# Patient Record
Sex: Female | Born: 1980 | Race: White | Hispanic: No | Marital: Married | Smoking: Current every day smoker
Health system: Southern US, Community
[De-identification: ages and names within clinical notes are randomized; demographics above are authoritative.]

## PROBLEM LIST (undated history)

## (undated) DIAGNOSIS — K589 Irritable bowel syndrome without diarrhea: Secondary | ICD-10-CM

## (undated) DIAGNOSIS — F1021 Alcohol dependence, in remission: Secondary | ICD-10-CM

## (undated) DIAGNOSIS — F509 Eating disorder, unspecified: Secondary | ICD-10-CM

## (undated) DIAGNOSIS — R1013 Epigastric pain: Secondary | ICD-10-CM

## (undated) DIAGNOSIS — F419 Anxiety disorder, unspecified: Secondary | ICD-10-CM

## (undated) DIAGNOSIS — F329 Major depressive disorder, single episode, unspecified: Secondary | ICD-10-CM

## (undated) DIAGNOSIS — K219 Gastro-esophageal reflux disease without esophagitis: Secondary | ICD-10-CM

## (undated) DIAGNOSIS — F32A Depression, unspecified: Secondary | ICD-10-CM

## (undated) HISTORY — DX: Eating disorder, unspecified: F50.9

## (undated) HISTORY — DX: Anxiety disorder, unspecified: F41.9

## (undated) HISTORY — DX: Irritable bowel syndrome, unspecified: K58.9

## (undated) HISTORY — DX: Alcohol dependence, in remission: F10.21

## (undated) HISTORY — DX: Depression, unspecified: F32.A

## (undated) HISTORY — DX: Gastro-esophageal reflux disease without esophagitis: K21.9

## (undated) HISTORY — DX: Major depressive disorder, single episode, unspecified: F32.9

---

## 2008-08-31 HISTORY — PX: UPPER GASTROINTESTINAL ENDOSCOPY: SHX188

## 2018-07-18 ENCOUNTER — Encounter: Payer: Self-pay | Admitting: Gastroenterology

## 2018-08-12 ENCOUNTER — Ambulatory Visit: Payer: 59 | Admitting: Gastroenterology

## 2018-08-12 ENCOUNTER — Other Ambulatory Visit (INDEPENDENT_AMBULATORY_CARE_PROVIDER_SITE_OTHER): Payer: 59

## 2018-08-12 ENCOUNTER — Encounter: Payer: Self-pay | Admitting: Gastroenterology

## 2018-08-12 VITALS — BP 127/77 | HR 77 | Ht 63.0 in | Wt 125.0 lb

## 2018-08-12 DIAGNOSIS — R109 Unspecified abdominal pain: Secondary | ICD-10-CM

## 2018-08-12 DIAGNOSIS — K5900 Constipation, unspecified: Secondary | ICD-10-CM | POA: Diagnosis not present

## 2018-08-12 DIAGNOSIS — K625 Hemorrhage of anus and rectum: Secondary | ICD-10-CM

## 2018-08-12 DIAGNOSIS — R6881 Early satiety: Secondary | ICD-10-CM

## 2018-08-12 DIAGNOSIS — R140 Abdominal distension (gaseous): Secondary | ICD-10-CM

## 2018-08-12 DIAGNOSIS — R1013 Epigastric pain: Secondary | ICD-10-CM

## 2018-08-12 DIAGNOSIS — F419 Anxiety disorder, unspecified: Secondary | ICD-10-CM

## 2018-08-12 LAB — CBC WITH DIFFERENTIAL/PLATELET
Basophils Absolute: 0 10*3/uL (ref 0.0–0.1)
Basophils Relative: 0.8 % (ref 0.0–3.0)
Eosinophils Absolute: 0.1 10*3/uL (ref 0.0–0.7)
Eosinophils Relative: 1.7 % (ref 0.0–5.0)
HCT: 44.6 % (ref 36.0–46.0)
Hemoglobin: 15.2 g/dL — ABNORMAL HIGH (ref 12.0–15.0)
Lymphocytes Relative: 26.2 % (ref 12.0–46.0)
Lymphs Abs: 1.6 10*3/uL (ref 0.7–4.0)
MCHC: 34.2 g/dL (ref 30.0–36.0)
MCV: 88.8 fl (ref 78.0–100.0)
Monocytes Absolute: 0.6 10*3/uL (ref 0.1–1.0)
Monocytes Relative: 9.4 % (ref 3.0–12.0)
Neutro Abs: 3.8 10*3/uL (ref 1.4–7.7)
Neutrophils Relative %: 61.9 % (ref 43.0–77.0)
Platelets: 197 10*3/uL (ref 150.0–400.0)
RBC: 5.02 Mil/uL (ref 3.87–5.11)
RDW: 13.5 % (ref 11.5–15.5)
WBC: 6.1 10*3/uL (ref 4.0–10.5)

## 2018-08-12 LAB — IGA: IgA: 152 mg/dL (ref 68–378)

## 2018-08-12 LAB — TSH: TSH: 1.52 u[IU]/mL (ref 0.35–4.50)

## 2018-08-12 LAB — COMPREHENSIVE METABOLIC PANEL
ALT: 7 U/L (ref 0–35)
AST: 15 U/L (ref 0–37)
Albumin: 4.6 g/dL (ref 3.5–5.2)
Alkaline Phosphatase: 41 U/L (ref 39–117)
BUN: 5 mg/dL — ABNORMAL LOW (ref 6–23)
CALCIUM: 9.5 mg/dL (ref 8.4–10.5)
CO2: 25 mEq/L (ref 19–32)
Chloride: 102 mEq/L (ref 96–112)
Creatinine, Ser: 0.69 mg/dL (ref 0.40–1.20)
GFR: 101.08 mL/min (ref >60.00)
Glucose, Bld: 84 mg/dL (ref 70–99)
Potassium: 4.2 mEq/L (ref 3.5–5.1)
Sodium: 137 mEq/L (ref 135–145)
Total Bilirubin: 0.5 mg/dL (ref 0.2–1.2)
Total Protein: 7 g/dL (ref 6.0–8.3)

## 2018-08-12 MED ORDER — OMEPRAZOLE 40 MG PO CPDR: 40.0000 mg | DELAYED_RELEASE_CAPSULE | Freq: Every day | ORAL | 3 refills | Status: DC

## 2018-08-12 MED ORDER — ONDANSETRON 4 MG PO TBDP: 4.0000 mg | ORAL_TABLET | Freq: Four times a day (QID) | ORAL | 3 refills | Status: DC | PRN

## 2018-08-12 MED ORDER — POLYETHYLENE GLYCOL 3350 17 G PO PACK: PACK | ORAL | 0 refills | Status: DC

## 2018-08-12 MED ORDER — DICYCLOMINE HCL 10 MG PO CAPS: 10.0000 mg | ORAL_CAPSULE | Freq: Three times a day (TID) | ORAL | 3 refills | Status: DC | PRN

## 2018-08-12 MED ORDER — SUPREP BOWEL PREP KIT 17.5-3.13-1.6 GM/177ML PO SOLN: ORAL | 0 refills | Status: DC

## 2018-08-12 NOTE — Progress Notes (Signed)
HPI :  37 year old female with a history of anxiety, reported irritable bowel syndrome, anorexia/bulimia, alcohol use, self-referred for new patient visit regarding multiple bowel symptoms.  She states she's had problems with her stomach in bowels for "years". Earlier in her life, several years ago, she had problems with anorexia and bulimia related to her anxiety. She reports that has been much better controlled recently and is not an active issue. She also reports a history of alcoholism with significant alcohol use in the past, she's been sober for the past 6 or 7 years now. No known history of liver disease.  She has multiple symptoms that bother her. She has discomfort in her upper abdomen after she eats, epigastric area. She eats about one meal a day due to the symptoms, often afraid to eat due to concerning symptoms of discomfort. She has early satiety that bothers her. She often belches her food back up after she eats. She feels like it "takes a long time" for her food to digest. She denies much reflux or regurgitation. She has rare dysphagia. She has frequent nausea, but not much vomiting. Main upper tract symptoms appear to be early satiety and upper abdominal discomfort after meals, and nausea. She also endorses a lot of bloating and abdominal distention that can occur. She usually has a bowel movement every morning, however doesn't often feel like she is evacuated enough. She denies any diarrhea. Has some periodic rectal bleeding, ccasionally has blood on the toilet paper and has some rectal pain. She is a squatting potty to help her bowel movements. She reports her stools are thin in nature. She has a lot of abdominal cramping.   She has not taked any PPIs in the past for any of the symptoms. She is currently taking digestive enzymes supplementation as well as a probiotic and fiber supplementation. She has significant symptoms of a daily basis despite taking these, unclear how much these help  her.  Her brother has Crohn's disease reportedly. She denies any family history of colon cancer. She smokes 1 pack per day of cigarettes. She denies any alcohol use currently. She denies any marijuana use. She had an EGD reportedly in 2003, she does not think that showed anything concerning however no report available. She's never had a prior colonoscopy. It does not appear she is ever been tested for celiac disease.  She reports she is extremely anxious about these symptoms and chronically does not feel well. She is taking Lexapro for anxiety and states it may take the [place] off her symptoms but doesn't provide any significant benefit. She is frustrated by the lack of improvement with symptoms over time.  We have no prior records of prior labs or workup for this.     Past Medical History:  Diagnosis Date  . Alcoholism in remission ([place])   . Anxiety   . Depression   . Eating disorder    history of anorexia and bulemia  . GERD (gastroesophageal reflux disease)   . IBS (irritable bowel syndrome)      Past Surgical History:  Procedure Laterality Date  . UPPER GASTROINTESTINAL ENDOSCOPY  2010   Family History  Problem Relation Age of Onset  . Colon cancer Paternal Grandmother    Social History   Tobacco Use  . Smoking status: Current Every Day Smoker    Packs/day: 1.00  . Smokeless tobacco: Never Used  Substance Use Topics  . Alcohol use: Not Currently  . Drug use: Never   Current  Outpatient Medications  Medication Sig Dispense Refill  . escitalopram (LEXAPRO) 20 MG tablet Take 20 mg by mouth daily.    Marland Kitchen dicyclomine (BENTYL) 10 MG capsule Take 1 capsule (10 mg total) by mouth every 8 (eight) hours as needed for spasms. 30 capsule 3  . omeprazole (PRILOSEC) 40 MG capsule Take 1 capsule (40 mg total) by mouth daily. 30 capsule 3  . ondansetron (ZOFRAN ODT) 4 MG disintegrating tablet Take 1 tablet (4 mg total) by mouth every 6 (six) hours as needed for nausea or vomiting. 30  tablet 3  . polyethylene glycol (MIRALAX) packet Take a double dose, as directed, daily 14 each 0  . SUPREP BOWEL PREP KIT 17.5-3.13-1.6 GM/177ML SOLN Suprep-Use as directed 354 mL 0   No current facility-administered medications for this visit.    No Known Allergies   Review of Systems: All systems reviewed and negative except where noted in HPI.     Physical Exam: BP 127/77   Pulse 77   Ht _0  (1.6 m)   Wt 125 lb (56.7 kg)   SpO2 98%   BMI 22.14 kg/m  Constitutional: Pleasant, female in no acute distress. HEENT: Normocephalic and atraumatic. Conjunctivae are normal. No scleral icterus. Neck supple.  Cardiovascular: Normal rate, regular rhythm.  Pulmonary/chest: Effort normal and breath sounds normal. No wheezing, rales or rhonchi. Abdominal: Soft, nondistended, nontender. There are no masses palpable. No hepatomegaly. Extremities: no edema Lymphadenopathy: No cervical adenopathy noted. Neurological: Alert and oriented to person place and time. Skin: Skin is warm and dry. No rashes noted. Psychiatric: Normal mood and affect. Behavior is normal.   ASSESSMENT AND PLAN: 37 year old female with history as outlined above, here for new patient assessment as outlined below:  Dyspepsia / early satiety / abdominal pain / rectal bleeding / constipation / anxiety - she's had longstanding symptoms in the setting of severe anxiety. I discussed ddx with her. 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 However, she does have a family history of Crohn's disease, and I think this needs to be ruled out as well as celiac disease. We have no prior workup on file for her, will send basic labs today - CBC, CMET, TSH, and labs for celiac disease. Also recommend EGD and colonoscopy - will rule out PUD, H pylori, celiac, IBD, etc. I have discussed the risks / benefits  of endoscopy and anesthesia with her and she wished to proceed. In the interim discussed some options to treat some of her symptoms. Will give her some Zofran to take for nausea, and give a trial of omeprazole 74m once daily for the next few weeks to treat dyspepsia and see if that helps. For her constipation recommend she take Miralax once daily and titrate up as needed. Will also provide bentyl to use PRN for cramps. If her endoscopies show pathology to cause her symptoms, we will address it. If they are normal without any pathology noted, will treat for dyspepsia and IBS. She would be a good candidate for buspirone given her anxiety. She agreed with the plan, further recommendations pending the results.   Total time 60 minutes spent with patient and coordinating care  SCarolina Cellar MD [facility]

## 2018-08-12 NOTE — Patient Instructions (Addendum)
If you are age 38 or older, your body mass index should be between 23-30. Your Body mass index is 22.14 kg/m. If this is out of the aforementioned range listed, please consider follow up with your Primary Care Provider.  If you are age 26 or younger, your body mass index should be between 19-25. Your Body mass index is 22.14 kg/m. If this is out of the aformentioned range listed, please consider follow up with your Primary Care Provider.   Your provider has requested that you go to the basement level for lab work before leaving today. Press "B" on the elevator. The lab is located at the first door on the left as you exit the elevator.  You have been scheduled for an endoscopy and colonoscopy. Please follow the written instructions given to you at your visit today. Please pick up your prep supplies at the pharmacy within the next 1-3 days. If you use inhalers (even only as needed), please bring them with you on the day of your procedure. Your physician has requested that you go to www.startemmi.com and enter the access code given to you at your visit today. This web site gives a general overview about your procedure. However, you should still follow specific instructions given to you by our office regarding your preparation for the procedure.  We have sent the following medications to your pharmacy for you to pick up at your convenience: Omeprazole 40mg : Take once daily zofran 4 mg ODT: Take every 6 hours as needed Bentyl 10mg : Take every 8 hours as needed  Please purchase the following medications over the counter and take as directed: Miralax: Take a daily dose daily   Please discontinue your Probiotic.  Thank you for entrusting me with your care and for choosing [facility], Dr. Leonardo Cellar

## 2018-08-15 LAB — TISSUE TRANSGLUTAMINASE, IGA: (tTG) Ab, IgA: 1 U/mL

## 2018-08-23 ENCOUNTER — Encounter: Payer: Self-pay | Admitting: Gastroenterology

## 2018-09-02 ENCOUNTER — Encounter: Payer: Self-pay | Admitting: Gastroenterology

## 2018-09-02 ENCOUNTER — Ambulatory Visit (AMBULATORY_SURGERY_CENTER): Payer: 59 | Admitting: Gastroenterology

## 2018-09-02 VITALS — BP 104/68 | HR 69 | Temp 97.5°F | Resp 14 | Ht 63.0 in | Wt 125.0 lb

## 2018-09-02 DIAGNOSIS — K5900 Constipation, unspecified: Secondary | ICD-10-CM

## 2018-09-02 DIAGNOSIS — K2970 Gastritis, unspecified, without bleeding: Secondary | ICD-10-CM | POA: Diagnosis not present

## 2018-09-02 DIAGNOSIS — K449 Diaphragmatic hernia without obstruction or gangrene: Secondary | ICD-10-CM | POA: Diagnosis not present

## 2018-09-02 DIAGNOSIS — R1013 Epigastric pain: Secondary | ICD-10-CM

## 2018-09-02 DIAGNOSIS — K2950 Unspecified chronic gastritis without bleeding: Secondary | ICD-10-CM

## 2018-09-02 DIAGNOSIS — K621 Rectal polyp: Secondary | ICD-10-CM

## 2018-09-02 DIAGNOSIS — D128 Benign neoplasm of rectum: Secondary | ICD-10-CM

## 2018-09-02 DIAGNOSIS — K625 Hemorrhage of anus and rectum: Secondary | ICD-10-CM

## 2018-09-02 MED ORDER — SODIUM CHLORIDE 0.9 % IV SOLN: 500.0000 mL | Freq: Once | INTRAVENOUS | Status: DC

## 2018-09-02 NOTE — Op Note (Signed)
[place] Patient Name: Cathy Hartman Procedure Date: 09/02/2018 2:21 PM MRN: [id number] Endoscopist: Remo Lipps P. Havery Moros , MD Age: 38 Referring MD:  Date of Birth: Nov 16, 1980 Gender: Female Account #: [id number] Procedure:                Colonoscopy Indications:              Abdominal pain, Rectal bleeding, Constipation -                            Miralax has helped constipation, bentyl has not                            provided much benefit Medicines:                Monitored Anesthesia Care Procedure:                Pre-Anesthesia Assessment:                           - Prior to the procedure, a History and Physical                            was performed, and patient medications and                            allergies were reviewed. The patient's tolerance of                            previous anesthesia was also reviewed. The risks                            and benefits of the procedure and the sedation                            options and risks were discussed with the patient.                            All questions were answered, and informed consent                            was obtained. Prior Anticoagulants: The patient has                            taken no previous anticoagulant or antiplatelet                            agents. ASA Grade Assessment: II - A patient with                            mild systemic disease. After reviewing the risks                            and benefits, the patient was deemed in  satisfactory condition to undergo the procedure.                           After obtaining informed consent, the colonoscope                            was passed under direct vision. Throughout the                            procedure, the patient's blood pressure, pulse, and                            oxygen saturations were monitored continuously. The                            Model PCF-[id number]) scope  was introduced                            through the anus and advanced to the the terminal                            ileum, with identification of the appendiceal                            orifice and IC valve. The colonoscopy was performed                            without difficulty. The patient tolerated the                            procedure well. The quality of the bowel                            preparation was good. The terminal ileum, ileocecal                            valve, appendiceal orifice, and rectum were                            photographed. Scope In: 2:40:28 PM Scope Out: 3:00:13 PM Scope Withdrawal Time: 0 hours 16 minutes 21 seconds  Total Procedure Duration: 0 hours 19 minutes 45 seconds  Findings:                 The perianal and digital rectal examinations were                            normal.                           The terminal ileum appeared normal.                           A diminutive polyp was found in the rectum. The  polyp was sessile. The polyp was removed with a                            cold biopsy forceps. Resection and retrieval were                            complete.                           Internal hemorrhoids were found during                            retroflexion. The hemorrhoids were small.                           The exam was otherwise without abnormality. Complications:            No immediate complications. Estimated blood loss:                            Minimal. Estimated Blood Loss:     Estimated blood loss was minimal. Impression:               - The examined portion of the ileum was normal.                           - One diminutive polyp in the rectum, removed with                            a cold biopsy forceps. Resected and retrieved.                           - Internal hemorrhoids.                           - The examination was otherwise normal.                           Suspect  bleeding is due to hemorrhoids in the                            setting of constipation. Suspect the patient may                            have IBS-C Recommendation:           - Patient has a contact number available for                            emergencies. The signs and symptoms of potential                            delayed complications were discussed with the                            patient. Return to normal activities tomorrow.  Written discharge instructions were provided to the                            patient.                           - Resume previous diet.                           - Continue present medications.                           - Will discuss other options with patient for                            management of symptoms                           - Await pathology results. Remo Lipps P. Armbruster, MD 09/02/2018 3:05:14 PM This report has been signed electronically.

## 2018-09-02 NOTE — Progress Notes (Signed)
To PACU, VSS. Report to [email or web address] 

## 2018-09-02 NOTE — Progress Notes (Signed)
Called to room to assist during endoscopic procedure.  Patient ID and intended procedure confirmed with present staff. Received instructions for my participation in the procedure from the performing physician.  

## 2018-09-02 NOTE — Op Note (Signed)
[place] Patient Name: Cathy Hartman Procedure Date: 09/02/2018 2:21 PM MRN: [id number] Endoscopist: Remo Lipps P. Havery Moros , MD Age: 38 Referring MD:  Date of Birth: 03/28/1981 Gender: Female Account #: [id number] Procedure:                Upper GI endoscopy Indications:              Epigastric abdominal pain, Dyspepsia - no                            improvement with omeprazole thus far. Serologic                            testing for celiac disease negative Medicines:                Monitored Anesthesia Care Procedure:                Pre-Anesthesia Assessment:                           - Prior to the procedure, a History and Physical                            was performed, and patient medications and                            allergies were reviewed. The patient's tolerance of                            previous anesthesia was also reviewed. The risks                            and benefits of the procedure and the sedation                            options and risks were discussed with the patient.                            All questions were answered, and informed consent                            was obtained. Prior Anticoagulants: The patient has                            taken no previous anticoagulant or antiplatelet                            agents. ASA Grade Assessment: II - A patient with                            mild systemic disease. After reviewing the risks                            and benefits, the patient was deemed in  satisfactory condition to undergo the procedure.                           After obtaining informed consent, the endoscope was                            passed under direct vision. Throughout the                            procedure, the patient's blood pressure, pulse, and                            oxygen saturations were monitored continuously. The                            Endoscope was introduced  through the mouth, and                            advanced to the second part of duodenum. The upper                            GI endoscopy was accomplished without difficulty.                            The patient tolerated the procedure well. Scope In: Scope Out: Findings:                 Esophagogastric landmarks were identified: the                            Z-line was found at 36 cm, the gastroesophageal                            junction was found at 36 cm and the upper extent of                            the gastric folds was found at 37 cm from the                            incisors.                           A 1 cm hiatal hernia was present.                           The exam of the esophagus was otherwise normal.                           The entire examined stomach was normal. Biopsies                            were taken with a cold forceps for Helicobacter  pylori testing.                           The duodenal bulb and second portion of the                            duodenum were normal. Complications:            No immediate complications. Estimated blood loss:                            Minimal. Estimated Blood Loss:     Estimated blood loss was minimal. Impression:               - Esophagogastric landmarks identified.                           - Normal esophagus otherwise                           - 1 cm hiatal hernia.                           - Normal stomach. Biopsied to rule out H pylori.                           - Normal duodenal bulb and second portion of the                            duodenum. Recommendation:           - Patient has a contact number available for                            emergencies. The signs and symptoms of potential                            delayed complications were discussed with the                            patient. Return to normal activities tomorrow.                            Written discharge  instructions were provided to the                            patient.                           - Resume previous diet.                           - Continue present medications.                           - Await pathology results.                           -  If H pylori positive will treat. If H pylori                            negative, will discuss options for management of                            dyspepsia Cathy Hartman. Cathy Genrich, MD 09/02/2018 3:09:44 PM This report has been signed electronically.

## 2018-09-02 NOTE — Patient Instructions (Signed)
YOU HAD AN ENDOSCOPIC PROCEDURE TODAY AT [facility]:   Refer to the procedure report that was given to you for any specific questions about what was found during the examination.  If the procedure report does not answer your questions, please call your gastroenterologist to clarify.  If you requested that your care partner not be given the details of your procedure findings, then the procedure report has been included in a sealed envelope for you to review at your convenience later.  YOU SHOULD EXPECT: Some feelings of bloating in the abdomen. Passage of more gas than usual.  Walking can help get rid of the air that was put into your GI tract during the procedure and reduce the bloating. If you had a lower endoscopy (such as a colonoscopy or flexible sigmoidoscopy) you may notice spotting of blood in your stool or on the toilet paper. If you underwent a bowel prep for your procedure, you may not have a normal bowel movement for a few days.  Please Note:  You might notice some irritation and congestion in your nose or some drainage.  This is from the oxygen used during your procedure.  There is no need for concern and it should clear up in a day or so.  SYMPTOMS TO REPORT IMMEDIATELY:   Following lower endoscopy (colonoscopy or flexible sigmoidoscopy):  Excessive amounts of blood in the stool  Significant tenderness or worsening of abdominal pains  Swelling of the abdomen that is new, acute  Fever of 100F or higher   Following upper endoscopy (EGD)  Vomiting of blood or coffee ground material  New chest pain or pain under the shoulder blades  Painful or persistently difficult swallowing  New shortness of breath  Fever of 100F or higher  Black, tarry-looking stools  For urgent or emergent issues, a gastroenterologist can be reached at any hour by calling [phone].   DIET:  We do recommend a small meal at first, but then you may proceed to your regular diet.  Drink  plenty of fluids but you should avoid alcoholic beverages for 24 hours.  ACTIVITY:  You should plan to take it easy for the rest of today and you should NOT DRIVE or use heavy machinery until tomorrow (because of the sedation medicines used during the test).    FOLLOW UP: Our staff will call the number listed on your records the next business day following your procedure to check on you and address any questions or concerns that you may have regarding the information given to you following your procedure. If we do not reach you, we will leave a message.  However, if you are feeling well and you are not experiencing any problems, there is no need to return our call.  We will assume that you have returned to your regular daily activities without incident.  If any biopsies were taken you will be contacted by phone or by letter within the next 1-3 weeks.  Please call us at [phone] if you have not heard about the biopsies in 3 weeks.    SIGNATURES/CONFIDENTIALITY: You and/or your care partner have signed paperwork which will be entered into your electronic medical record.  These signatures attest to the fact that that the information above on your After Visit Summary has been reviewed and is understood.  Full responsibility of the confidentiality of this discharge information lies with you and/or your care-partner.   Polyp, hemorrhoid and hiatal hernia information given.

## 2018-09-02 NOTE — Progress Notes (Signed)
Pt's states no medical or surgical changes since previsit or office visit. 

## 2018-09-05 ENCOUNTER — Telehealth: Payer: Self-pay | Admitting: *Deleted

## 2018-09-05 NOTE — Telephone Encounter (Signed)
  Follow up Call-  Call back number 09/02/2018  Post procedure Call Back phone  # [phone]  Permission to leave phone message Yes     Patient questions:  Do you have a fever, pain , or abdominal swelling? No. Pain Score  0 *  Have you tolerated food without any problems? Yes.    Have you been able to return to your normal activities? Yes.    Do you have any questions about your discharge instructions: Diet   No. Medications  No. Follow up visit  No.  Do you have questions or concerns about your Care? No.  Actions: * If pain score is 4 or above: No action needed, pain <4.

## 2018-09-12 ENCOUNTER — Telehealth: Payer: Self-pay | Admitting: Gastroenterology

## 2018-09-12 NOTE — Telephone Encounter (Signed)
Pt is returning your call about lab results.

## 2018-09-13 ENCOUNTER — Telehealth: Payer: Self-pay | Admitting: Gastroenterology

## 2018-09-13 NOTE — Telephone Encounter (Signed)
See result note from 09-02-18 path

## 2018-09-13 NOTE — Telephone Encounter (Signed)
Pt returned call from yesterday regarding path results. Pt is requesting a call asap, she states that her sxs have worsened and medication is not helping.

## 2018-09-13 NOTE — Telephone Encounter (Signed)
This needs to be sent to Jan, she was working on this per the result note.

## 2018-09-14 ENCOUNTER — Other Ambulatory Visit: Payer: Self-pay

## 2018-09-14 MED ORDER — BUSPIRONE HCL 7.5 MG PO TABS: 7.5000 mg | ORAL_TABLET | Freq: Two times a day (BID) | ORAL | 3 refills | Status: DC

## 2018-09-14 NOTE — Progress Notes (Signed)
Prescription sent to pharmacy for buspirone according to result note

## 2018-09-28 ENCOUNTER — Telehealth: Payer: Self-pay | Admitting: Gastroenterology

## 2018-09-28 NOTE — Telephone Encounter (Signed)
Cathy Hartman had recommend Buspirone 7.5mg  BID for 2 weeks and then increase to 15mg  BID if she tolerates the lower dose. If she has not tried increasing dose Hartman would do so. Otherwise, if she has not yet had an [place] to evaluate her gallbladder Hartman think that is reasonable, you can order a RUQ [place] to rule out gallstones. thanks

## 2018-09-28 NOTE — Telephone Encounter (Signed)
Please advise if you would like to prescribe alternative medications or if you would like for the patient to be set up with a follow up appt at the office with you or APP;

## 2018-09-28 NOTE — Telephone Encounter (Signed)
Patient states that medication for nausea, stomach burning is not helping, wants something stronger. Would like to speak to Dr. Trinna Balloon regarding next step has questions about possible gallstones

## 2018-09-29 NOTE — Telephone Encounter (Signed)
The buspirone is hopefully going to help with that.  If she wishes we can try some carafate tablets, 1 tab every 6 hours as needed, and see if that helps. Thanks

## 2018-09-29 NOTE — Telephone Encounter (Signed)
Pt calling back for an update  

## 2018-09-29 NOTE — Telephone Encounter (Signed)
Called and spoke with patient-patient informed of MD recommendations; patient is agreeable with plan of care, however, is requesting to know if there is any other medication that will help with the "burning" feeling; =   Please advise on medication therapy;  As patient is taking her medications "exactly how Dr. Havery Moros told me to but the burning sensation is really bad"; confirmed the patient is taking her Omeprazole as directed;     Patient verbalized understanding of information/instructions;  Patient was advised to call the office at [phone] if questions/concerns arise;

## 2018-09-30 ENCOUNTER — Other Ambulatory Visit: Payer: Self-pay

## 2018-09-30 DIAGNOSIS — R1013 Epigastric pain: Secondary | ICD-10-CM

## 2018-09-30 MED ORDER — SUCRALFATE 1 G PO TABS: 1.0000 g | ORAL_TABLET | Freq: Four times a day (QID) | ORAL | 3 refills | Status: DC | PRN

## 2018-09-30 NOTE — Telephone Encounter (Signed)
Called and spoke with patient-patient informed of MD recommendations; patient is agreeable with plan of care; RX sent to pharmacy of patient preference; Patient verbalized understanding of information/instructions;  Patient was advised to call the office at [phone] if questions/concerns arise;

## 2018-09-30 NOTE — Telephone Encounter (Signed)
Sorry about that. Carafate 1gm tablet, every 6 hours PRN. #60 RF#3. Thanks

## 2018-09-30 NOTE — Telephone Encounter (Signed)
Please advise on medication Dispense: Refills:

## 2018-10-03 ENCOUNTER — Telehealth: Payer: Self-pay | Admitting: Gastroenterology

## 2018-10-03 DIAGNOSIS — R109 Unspecified abdominal pain: Secondary | ICD-10-CM

## 2018-10-03 DIAGNOSIS — R140 Abdominal distension (gaseous): Secondary | ICD-10-CM

## 2018-10-03 DIAGNOSIS — R1013 Epigastric pain: Secondary | ICD-10-CM

## 2018-10-03 NOTE — Telephone Encounter (Signed)
The pt states she has continued nausea, abd pain, and bloating. She is taking omeprazole 40 mg, zofran, carafate and buspar none of which have helped.  She also states that since being taken off of lexapro her emotions are "all over the place".  She wants to know if she can have an [place] to evaluate her gallbladder.    Per order from Dr Havery Moros [place] has been scheduled.   You have been scheduled for an abdominal ultrasound at [facility] (1st floor of hospital) on 10/05/18 at 8 am. Please arrive 15 minutes prior to your appointment for registration. Make certain not to have anything to eat or drink 6 hours prior to your appointment. Should you need to reschedule your appointment, please contact radiology at [phone]. This test typically takes about 30 minutes to perform.   The patient has been notified of this information and all questions answered. She will follow up with PCP regarding anxiety.

## 2018-10-03 NOTE — Telephone Encounter (Signed)
Pt wants to schedule [place] to check for her bladder. Pls call her.

## 2018-10-04 ENCOUNTER — Ambulatory Visit: Payer: 59 | Admitting: Family Medicine

## 2018-10-04 ENCOUNTER — Encounter: Payer: Self-pay | Admitting: Family Medicine

## 2018-10-04 VITALS — BP 100/70 | HR 79 | Temp 98.9°F | Ht 63.0 in | Wt 123.1 lb

## 2018-10-04 DIAGNOSIS — F418 Other specified anxiety disorders: Secondary | ICD-10-CM | POA: Diagnosis not present

## 2018-10-04 DIAGNOSIS — Z0000 Encounter for general adult medical examination without abnormal findings: Secondary | ICD-10-CM | POA: Insufficient documentation

## 2018-10-04 DIAGNOSIS — F19939 Other psychoactive substance use, unspecified with withdrawal, unspecified: Secondary | ICD-10-CM | POA: Diagnosis not present

## 2018-10-04 LAB — URINALYSIS, ROUTINE W REFLEX MICROSCOPIC
Bilirubin Urine: NEGATIVE
Hgb urine dipstick: NEGATIVE
Ketones, ur: NEGATIVE
LEUKOCYTES UA: NEGATIVE
NITRITE: NEGATIVE
RBC / HPF: NONE SEEN (ref 0–?)
Specific Gravity, Urine: <=1.005 — AB (ref 1.000–1.030)
Total Protein, Urine: NEGATIVE
Urine Glucose: NEGATIVE
Urobilinogen, UA: 0.2 (ref 0.0–1.0)
WBC, UA: NONE SEEN (ref 0–?)
pH: 6 (ref 5.0–8.0)

## 2018-10-04 LAB — LIPID PANEL
Cholesterol: 209 mg/dL — ABNORMAL HIGH (ref 0–200)
HDL: 63 mg/dL (ref >39.00)
LDL Cholesterol: 137 mg/dL — ABNORMAL HIGH (ref 0–99)
NonHDL: 146.37
Total CHOL/HDL Ratio: 3
Triglycerides: 48 mg/dL (ref 0.0–149.0)
VLDL: 9.6 mg/dL (ref 0.0–40.0)

## 2018-10-04 MED ORDER — FLUOXETINE HCL 20 MG PO TABS: 20.0000 mg | ORAL_TABLET | Freq: Every day | ORAL | 1 refills | Status: DC

## 2018-10-04 MED ORDER — CAMILA 0.35 MG PO TABS: 1.0000 | ORAL_TABLET | Freq: Every day | ORAL | 0 refills | Status: DC

## 2018-10-04 NOTE — Progress Notes (Signed)
Established Patient Office Visit  Subjective:  Patient ID: Cathy Hartman, female    DOB: 1981-01-05  Age: 38 y.o. MRN: [id number]  CC:  Chief Complaint  Patient presents with  . Establish Care    HPI Cathy Hartman presents for follow-up of her anxiety and depression.  She had taken Lexapro with good results over the last few years.  She has a longstanding history of reflux with irritable bowel syndrome.  She is undergone many life changes in the last year or so.  She moved into this area from [place] to be married and started a new job.  As a probable result of these life changes for irritable bowel and reflux symptoms became worse.  Status post recent GI work-up with colonoscopy and EGD.  EGD showed a small hiatal hernia.  She is scheduled for an abdominal ultrasound to check for gallstones tomorrow.  She stopped her Lexapro 2 weeks ago after a week taper and was started on BuSpar.  BuSpar has not agreed with her.  She has taken Prozac in the past with good results.  History of anorexia and bulimia as a teenager.  She has no further issues with her body image as an adult.  She is scheduled to see GYN next month for routine women care.  She would like to become pregnant at some point in the relatively near future.  She is sober and recovering for 7 years now.  Past Medical History:  Diagnosis Date  . Alcoholism in remission ([place])   . Anxiety   . Depression   . Eating disorder    history of anorexia and bulemia  . GERD (gastroesophageal reflux disease)   . IBS (irritable bowel syndrome)     Past Surgical History:  Procedure Laterality Date  . UPPER GASTROINTESTINAL ENDOSCOPY  2010    Family History  Problem Relation Age of Onset  . Esophageal cancer Paternal Uncle   . Rectal cancer Neg Hx   . Colon cancer Neg Hx   . Stomach cancer Neg Hx     Social History   Socioeconomic History  . Marital status: Married    Spouse name: Not on file  . Number of children: Not on  file  . Years of education: Not on file  . Highest education level: Not on file  Occupational History  . Occupation: color stylist   Social Needs  . Financial resource strain: Not on file  . Food insecurity:    Worry: Not on file    Inability: Not on file  . Transportation needs:    Medical: Not on file    Non-medical: Not on file  Tobacco Use  . Smoking status: Current Every Day Smoker    Packs/day: 1.00  . Smokeless tobacco: Never Used  Substance and Sexual Activity  . Alcohol use: Not Currently  . Drug use: Never  . Sexual activity: Yes  Lifestyle  . Physical activity:    Days per week: Not on file    Minutes per session: Not on file  . Stress: Not on file  Relationships  . Social connections:    Talks on phone: Not on file    Gets together: Not on file    Attends religious service: Not on file    Active member of club or organization: Not on file    Attends meetings of clubs or organizations: Not on file    Relationship status: Not on file  . Intimate partner violence:  Fear of current or ex partner: Not on file    Emotionally abused: Not on file    Physically abused: Not on file    Forced sexual activity: Not on file  Other Topics Concern  . Not on file  Social History Narrative  . Not on file    Outpatient Medications Prior to Visit  Medication Sig Dispense Refill  . dicyclomine (BENTYL) 10 MG capsule Take 1 capsule (10 mg total) by mouth every 8 (eight) hours as needed for spasms. 30 capsule 3  . omeprazole (PRILOSEC) 40 MG capsule Take 1 capsule (40 mg total) by mouth daily. 30 capsule 3  . ondansetron (ZOFRAN ODT) 4 MG disintegrating tablet Take 1 tablet (4 mg total) by mouth every 6 (six) hours as needed for nausea or vomiting. 30 tablet 3  . polyethylene glycol (MIRALAX) packet Take a double dose, as directed, daily 14 each 0  . sucralfate (CARAFATE) 1 g tablet Take 1 tablet (1 g total) by mouth every 6 (six) hours as needed. 60 tablet 3  . CAMILA 0.35  MG tablet Take 1 tablet by mouth daily.    . busPIRone (BUSPAR) 7.5 MG tablet Take 1 tablet (7.5 mg total) by mouth 2 (two) times daily. Then increase to 2 tablets (15 mg) 2 times daily thereafter as tolerated 60 tablet 3  . escitalopram (LEXAPRO) 20 MG tablet Take 20 mg by mouth daily.     No facility-administered medications prior to visit.     No Known Allergies  ROS Review of Systems  Constitutional: Negative.   HENT: Negative.   Eyes: Negative.   Respiratory: Negative.   Cardiovascular: Negative.   Gastrointestinal: Positive for abdominal pain and nausea. Negative for anal bleeding and blood in stool.  Endocrine: Negative for polyphagia and polyuria.  Genitourinary: Negative.   Skin: Negative for pallor.  Allergic/Immunologic: Negative for immunocompromised state.  Neurological: Negative for seizures, light-headedness and numbness.  Hematological: Does not bruise/bleed easily.  Psychiatric/Behavioral: Positive for dysphoric mood. Negative for self-injury. The patient is nervous/anxious.    Depression screen PHQ 2/9 10/04/2018  Decreased Interest 2  Down, Depressed, Hopeless 1  PHQ - 2 Score 3  Altered sleeping 0  Tired, decreased energy 3  Change in appetite 1  Feeling bad or failure about yourself  0  Trouble concentrating 1  Moving slowly or fidgety/restless 1  Suicidal thoughts 0  PHQ-9 Score 9  Difficult doing work/chores Very difficult      Objective:    Physical Exam  Constitutional: She is oriented to person, place, and time. She appears well-developed and well-nourished. No distress.  HENT:  Head: Normocephalic and atraumatic.  Right Ear: External ear normal.  Left Ear: External ear normal.  Eyes: Right eye exhibits no discharge. Left eye exhibits no discharge. No scleral icterus.  Neck: No JVD present. No tracheal deviation present.  Pulmonary/Chest: Effort normal. No stridor.  Neurological: She is alert and oriented to person, place, and time.  Skin:  Skin is warm and dry. She is not diaphoretic.  Psychiatric: She has a normal mood and affect. Her behavior is normal.    BP 100/70   Pulse 79   Temp 98.9 F (37.2 C) (Oral)   Ht 5\' 3"  (1.6 m)   Wt 123 lb 2 oz (55.8 kg)   SpO2 99%   BMI 21.81 kg/m  Wt Readings from Last 3 Encounters:  10/04/18 123 lb 2 oz (55.8 kg)  09/02/18 125 lb (56.7 kg)  08/12/18 125  lb (56.7 kg)   BP Readings from Last 3 Encounters:  10/04/18 100/70  09/02/18 104/68  08/12/18 127/77   Guideline developer:  UpToDate (see UpToDate for funding source) Date Released: June 2014  Health Maintenance Due  Topic Date Due  . HIV Screening  05/23/1995  . TETANUS/TDAP  05/23/1999  . PAP SMEAR-Modifier  05/22/2001  . INFLUENZA VACCINE  03/31/2018    There are no preventive care reminders to display for this patient.  Lab Results  Component Value Date   TSH 1.52 08/12/2018   Lab Results  Component Value Date   WBC 6.1 08/12/2018   HGB 15.2 (H) 08/12/2018   HCT 44.6 08/12/2018   MCV 88.8 08/12/2018   PLT 197.0 08/12/2018   Lab Results  Component Value Date   NA 137 08/12/2018   K 4.2 08/12/2018   CO2 25 08/12/2018   GLUCOSE 84 08/12/2018   BUN 5 (L) 08/12/2018   CREATININE 0.69 08/12/2018   BILITOT 0.5 08/12/2018   ALKPHOS 41 08/12/2018   AST 15 08/12/2018   ALT 7 08/12/2018   PROT 7.0 08/12/2018   ALBUMIN 4.6 08/12/2018   CALCIUM 9.5 08/12/2018   GFR 101.08 08/12/2018   No results found for: CHOL No results found for: HDL No results found for: LDLCALC No results found for: TRIG No results found for: CHOLHDL No results found for: HGBA1C    Assessment & Plan:   Problem List Items Addressed This Visit      Other   Depression with anxiety - Primary   Relevant Medications   FLUoxetine (PROZAC) 20 MG tablet   Healthcare maintenance   Relevant Medications   CAMILA 0.35 MG tablet   Other Relevant Orders   Lipid panel   Urinalysis, Routine w reflex microscopic   Drug withdrawal  syndrome ([place])      Meds ordered this encounter  Medications  . CAMILA 0.35 MG tablet    Sig: Take 1 tablet (0.35 mg total) by mouth daily.    Dispense:  1 Package    Refill:  0  . FLUoxetine (PROZAC) 20 MG tablet    Sig: Take 1 tablet (20 mg total) by mouth daily.    Dispense:  30 tablet    Refill:  1    Follow-up: Return in about 1 month (around 11/02/2018).   Restart Prozac for her 20 mg.  She is taking 40 mg in the past with good result.  Hopefully this will help her current symptoms.  CBC, CMP and his TSH drawn in December were all normal.  GYN appointment for Pap pelvic and discussion of future pregnancy in March.

## 2018-10-05 ENCOUNTER — Ambulatory Visit (HOSPITAL_COMMUNITY)
Admission: RE | Admit: 2018-10-05 | Discharge: 2018-10-05 | Disposition: A | Discharge status: Home / Self Care | Payer: 59 | Source: Ambulatory Visit | Attending: Gastroenterology | Admitting: Gastroenterology

## 2018-10-05 DIAGNOSIS — R1013 Epigastric pain: Secondary | ICD-10-CM | POA: Diagnosis not present

## 2018-10-05 DIAGNOSIS — R140 Abdominal distension (gaseous): Secondary | ICD-10-CM

## 2018-10-05 DIAGNOSIS — R109 Unspecified abdominal pain: Secondary | ICD-10-CM

## 2018-10-19 ENCOUNTER — Telehealth: Payer: Self-pay | Admitting: Family Medicine

## 2018-10-19 DIAGNOSIS — R110 Nausea: Secondary | ICD-10-CM

## 2018-10-19 DIAGNOSIS — K219 Gastro-esophageal reflux disease without esophagitis: Secondary | ICD-10-CM

## 2018-10-19 NOTE — Telephone Encounter (Signed)
Copied from [place] [id number]. Topic: Quick Communication - Rx Refill/Question >> Oct 19, 2018  3:41 PM Margot Ables wrote: Medication: ondansetron (ZOFRAN ODT) 4 MG disintegrating tablet - pt has pills left - taking 2 pills 3/day most days. Pt states she takes for nausea and wants thru Dr. Ethelene Hal as they are working together on anxiety and seeing if her stomach issues are related  Pt states she is buying nexium 40mg  OTC. She is wondering if Dr. Ethelene Hal can order for her RX. She said omeprazole did not work for her. She has been taking for 1-2 weeks and is already helping with the burning feeling.  Has the patient contacted their pharmacy? Yes - no refills left from Dr. Havery Moros Preferred Pharmacy (with phone number or street name): [facility] [place], [place] - [address] [phone] (Phone) [phone] (Fax)

## 2018-10-20 ENCOUNTER — Other Ambulatory Visit: Payer: Self-pay | Admitting: Family Medicine

## 2018-10-20 DIAGNOSIS — K219 Gastro-esophageal reflux disease without esophagitis: Secondary | ICD-10-CM

## 2018-10-20 MED ORDER — ESOMEPRAZOLE MAGNESIUM 40 MG PO PACK: 40.0000 mg | PACK | Freq: Every day | ORAL | 1 refills | Status: DC

## 2018-10-20 MED ORDER — ONDANSETRON 4 MG PO TBDP: 4.0000 mg | ORAL_TABLET | Freq: Four times a day (QID) | ORAL | 3 refills | Status: DC | PRN

## 2018-10-20 NOTE — Addendum Note (Signed)
Addended by: Jon Billings on: 10/20/2018 08:29 AM   Modules accepted: Orders

## 2018-10-20 NOTE — Telephone Encounter (Signed)
I left patient a voicemail letting her know that both prescriptions have been sent in.

## 2018-10-24 ENCOUNTER — Other Ambulatory Visit: Payer: Self-pay | Admitting: Family Medicine

## 2018-10-24 DIAGNOSIS — Z0000 Encounter for general adult medical examination without abnormal findings: Secondary | ICD-10-CM

## 2018-10-26 ENCOUNTER — Other Ambulatory Visit: Payer: Self-pay | Admitting: Family Medicine

## 2018-10-26 DIAGNOSIS — F418 Other specified anxiety disorders: Secondary | ICD-10-CM

## 2018-11-03 ENCOUNTER — Encounter: Payer: Self-pay | Admitting: Family Medicine

## 2018-11-03 ENCOUNTER — Ambulatory Visit: Payer: 59 | Admitting: Family Medicine

## 2018-11-03 VITALS — BP 100/70 | HR 60 | Ht 63.0 in | Wt 122.0 lb

## 2018-11-03 DIAGNOSIS — K581 Irritable bowel syndrome with constipation: Secondary | ICD-10-CM

## 2018-11-03 DIAGNOSIS — F418 Other specified anxiety disorders: Secondary | ICD-10-CM | POA: Diagnosis not present

## 2018-11-03 DIAGNOSIS — K219 Gastro-esophageal reflux disease without esophagitis: Secondary | ICD-10-CM | POA: Diagnosis not present

## 2018-11-03 MED ORDER — FLUOXETINE HCL 40 MG PO CAPS: 40.0000 mg | ORAL_CAPSULE | Freq: Every day | ORAL | 1 refills | Status: DC

## 2018-11-03 MED ORDER — ONDANSETRON HCL 8 MG PO TABS: 8.0000 mg | ORAL_TABLET | Freq: Three times a day (TID) | ORAL | 1 refills | Status: DC | PRN

## 2018-11-03 MED ORDER — LINACLOTIDE 72 MCG PO CAPS: 72.0000 ug | ORAL_CAPSULE | Freq: Every day | ORAL | 2 refills | Status: DC

## 2018-11-03 MED ORDER — ESOMEPRAZOLE MAGNESIUM 40 MG PO CPDR: 40.0000 mg | DELAYED_RELEASE_CAPSULE | Freq: Two times a day (BID) | ORAL | 1 refills | Status: DC

## 2018-11-03 NOTE — Patient Instructions (Signed)
Linaclotide oral capsules  What is this medicine?  LINACLOTIDE (lin a KLOE tide) is used to treat irritable bowel syndrome (IBS) with constipation as the main problem. It may also be used for relief of chronic constipation.  This medicine may be used for other purposes; ask your health care provider or pharmacist if you have questions.  COMMON BRAND NAME(S): Linzess  What should I tell my health care provider before I take this medicine?  They need to know if you have any of these conditions:  -history of stool (fecal) impaction  -now have diarrhea or have diarrhea often  -other medical condition  -stomach or intestinal disease, including bowel obstruction or abdominal adhesions  -an unusual or allergic reaction to linaclotide, other medicines, foods, dyes, or preservatives  -pregnant or trying to get pregnant  -breast-feeding  How should I use this medicine?  Take this medicine by mouth with a glass of water. Follow the directions on the prescription label. Do not cut, crush or chew this medicine. Take on an empty stomach, at least 30 minutes before your first meal of the day. Take your medicine at regular intervals. Do not take your medicine more often than directed. Do not stop taking except on your doctor's advice.  A special MedGuide will be given to you by the pharmacist with each prescription and refill. Be sure to read this information carefully each time.  Talk to your pediatrician regarding the use of this medicine in children. This medicine is not approved for use in children.  Overdosage: If you think you have taken too much of this medicine contact a poison control center or emergency room at once.  NOTE: This medicine is only for you. Do not share this medicine with others.  What if I miss a dose?  If you miss a dose, just skip that dose. Wait until your next dose, and take only that dose. Do not take double or extra doses.  What may interact with this medicine?  -certain medicines for bowel problems  or bladder incontinence (these can cause constipation)  This list may not describe all possible interactions. Give your health care provider a list of all the medicines, herbs, non-prescription drugs, or dietary supplements you use. Also tell them if you smoke, drink alcohol, or use illegal drugs. Some items may interact with your medicine.  What should I watch for while using this medicine?  Visit your doctor for regular check ups. Tell your doctor if your symptoms do not get better or if they get worse.  Diarrhea is a common side effect of this medicine. It often begins within 2 weeks of starting this medicine. Stop taking this medicine and call your doctor if you get severe diarrhea.  Stop taking this medicine and call your doctor or go to the nearest hospital emergency room right away if you develop unusual or severe stomach-area (abdominal) pain, especially if you also have bright red, bloody stools or black stools that look like tar.  What side effects may I notice from receiving this medicine?  Side effects that you should report to your doctor or health care professional as soon as possible:  -allergic reactions like skin rash, itching or hives, swelling of the face, lips, or tongue  -black, tarry stools  -bloody or watery diarrhea  -new or worsening stomach pain  -severe or prolonged diarrhea  Side effects that usually do not require medical attention (report to your doctor or health care professional if they continue or are   bothersome):  -bloating  -gas  -loose stools  This list may not describe all possible side effects. Call your doctor for medical advice about side effects. You may report side effects to FDA at 1-800-FDA-1088.  Where should I keep my medicine?  Keep out of the reach of children.  Store at room temperature between 20 and 25 degrees C (68 and 77 degrees F). Keep this medicine in the original container. Keep tightly closed in a dry place. Do not remove the desiccant packet from the bottle,  it helps to protect your medicine from moisture. Throw away any unused medicine after the expiration date.  NOTE: This sheet is a summary. It may not cover all possible information. If you have questions about this medicine, talk to your doctor, pharmacist, or health care provider.   2019 Elsevier/Gold Standard (2015-09-19 12:17:04)

## 2018-11-03 NOTE — Progress Notes (Signed)
Established Patient Office Visit  Subjective:  Patient ID: Cathy Hartman, female    DOB: 06-10-1981  Age: 38 y.o. MRN: [id number]  CC:  Chief Complaint  Patient presents with  . Follow-up    HPI Cathy Hartman presents for follow-up of her depression, reflux and irritable bowel with constipation.  Patient feels that her depression is improved with the 20 mg Prozac but feels like she could use a higher dose.  Anxiety is somewhat relieved but still there the Nexium has helped her reflux but she feels as though she still has burning up into her chest.  She is concerned about her hiatal hernia.  She also has a history of irritable bowel associated with constipation.  She is tried Bentyl and this seems to be making matters worse.  She has been taking MiraLAX regularly and feels as though it has minimal effect.  She per report symptoms of delayed gastric emptying.  He is taking Zofran often and requests a higher dose.  Past Medical History:  Diagnosis Date  . Alcoholism in remission ([place])   . Anxiety   . Depression   . Eating disorder    history of anorexia and bulemia  . GERD (gastroesophageal reflux disease)   . IBS (irritable bowel syndrome)     Past Surgical History:  Procedure Laterality Date  . UPPER GASTROINTESTINAL ENDOSCOPY  2010    Family History  Problem Relation Age of Onset  . Esophageal cancer Paternal Uncle   . Rectal cancer Neg Hx   . Colon cancer Neg Hx   . Stomach cancer Neg Hx     Social History   Socioeconomic History  . Marital status: Married    Spouse name: Not on file  . Number of children: Not on file  . Years of education: Not on file  . Highest education level: Not on file  Occupational History  . Occupation: color stylist   Social Needs  . Financial resource strain: Not on file  . Food insecurity:    Worry: Not on file    Inability: Not on file  . Transportation needs:    Medical: Not on file    Non-medical: Not on file  Tobacco Use  .  Smoking status: Current Every Day Smoker    Packs/day: 1.00  . Smokeless tobacco: Never Used  Substance and Sexual Activity  . Alcohol use: Not Currently  . Drug use: Never  . Sexual activity: Yes  Lifestyle  . Physical activity:    Days per week: Not on file    Minutes per session: Not on file  . Stress: Not on file  Relationships  . Social connections:    Talks on phone: Not on file    Gets together: Not on file    Attends religious service: Not on file    Active member of club or organization: Not on file    Attends meetings of clubs or organizations: Not on file    Relationship status: Not on file  . Intimate partner violence:    Fear of current or ex partner: Not on file    Emotionally abused: Not on file    Physically abused: Not on file    Forced sexual activity: Not on file  Other Topics Concern  . Not on file  Social History Narrative  . Not on file    Outpatient Medications Prior to Visit  Medication Sig Dispense Refill  . CAMILA 0.35 MG tablet TAKE 1 TABLET BY MOUTH EVERY  DAY 28 tablet 0  . dicyclomine (BENTYL) 10 MG capsule Take 1 capsule (10 mg total) by mouth every 8 (eight) hours as needed for spasms. 30 capsule 3  . ondansetron (ZOFRAN ODT) 4 MG disintegrating tablet Take 1 tablet (4 mg total) by mouth every 6 (six) hours as needed for nausea or vomiting. 30 tablet 3  . polyethylene glycol (MIRALAX) packet Take a double dose, as directed, daily 14 each 0  . sucralfate (CARAFATE) 1 g tablet Take 1 tablet (1 g total) by mouth every 6 (six) hours as needed. 60 tablet 3  . esomeprazole (NEXIUM) 40 MG capsule Take 1 capsule (40 mg total) by mouth daily before breakfast. 30 capsule 3  . FLUoxetine (PROZAC) 20 MG tablet Take 20 mg by mouth daily.    . Fluoxetine HCl, PMDD, 20 MG TABS TAKE 1 TABLET BY MOUTH EVERY DAY 90 tablet 1   No facility-administered medications prior to visit.     No Known Allergies  ROS Review of Systems  Constitutional: Negative for  chills, diaphoresis, fatigue, fever and unexpected weight change.  HENT: Negative.   Eyes: Negative for photophobia and visual disturbance.  Respiratory: Negative for shortness of breath and wheezing.   Gastrointestinal: Positive for abdominal pain, constipation, nausea and vomiting. Negative for diarrhea.  Endocrine: Negative for polyphagia and polyuria.  Genitourinary: Negative.   Musculoskeletal: Negative for arthralgias and myalgias.  Allergic/Immunologic: Negative for immunocompromised state.  Neurological: Negative.   Psychiatric/Behavioral: Positive for dysphoric mood. The patient is nervous/anxious.       Objective:    Physical Exam  Constitutional: She is oriented to person, place, and time. She appears well-developed and well-nourished. No distress.  HENT:  Head: Normocephalic and atraumatic.  Right Ear: External ear normal.  Left Ear: External ear normal.  Eyes: Conjunctivae are normal. Right eye exhibits no discharge. Left eye exhibits no discharge. No scleral icterus.  Neck: No JVD present. No tracheal deviation present.  Pulmonary/Chest: Effort normal. No stridor.  Neurological: She is alert and oriented to person, place, and time.  Skin: Skin is warm and dry. She is not diaphoretic.  Psychiatric: She has a normal mood and affect. Her behavior is normal.    BP 100/70   Pulse 60   Ht 5\' 3"  (1.6 m)   Wt 122 lb (55.3 kg)   SpO2 96%   BMI 21.61 kg/m  Wt Readings from Last 3 Encounters:  11/03/18 122 lb (55.3 kg)  10/04/18 123 lb 2 oz (55.8 kg)  09/02/18 125 lb (56.7 kg)   BP Readings from Last 3 Encounters:  11/03/18 100/70  10/04/18 100/70  09/02/18 104/68   Guideline developer:  UpToDate (see UpToDate for funding source) Date Released: June 2014  Health Maintenance Due  Topic Date Due  . HIV Screening  05/23/1995  . TETANUS/TDAP  05/23/1999  . PAP SMEAR-Modifier  05/22/2001  . INFLUENZA VACCINE  03/31/2018    There are no preventive care reminders  to display for this patient.  Lab Results  Component Value Date   TSH 1.52 08/12/2018   Lab Results  Component Value Date   WBC 6.1 08/12/2018   HGB 15.2 (H) 08/12/2018   HCT 44.6 08/12/2018   MCV 88.8 08/12/2018   PLT 197.0 08/12/2018   Lab Results  Component Value Date   NA 137 08/12/2018   K 4.2 08/12/2018   CO2 25 08/12/2018   GLUCOSE 84 08/12/2018   BUN 5 (L) 08/12/2018   CREATININE 0.69 08/12/2018  BILITOT 0.5 08/12/2018   ALKPHOS 41 08/12/2018   AST 15 08/12/2018   ALT 7 08/12/2018   PROT 7.0 08/12/2018   ALBUMIN 4.6 08/12/2018   CALCIUM 9.5 08/12/2018   GFR 101.08 08/12/2018   Lab Results  Component Value Date   CHOL 209 (H) 10/04/2018   Lab Results  Component Value Date   HDL 63.00 10/04/2018   Lab Results  Component Value Date   LDLCALC 137 (H) 10/04/2018   Lab Results  Component Value Date   TRIG 48.0 10/04/2018   Lab Results  Component Value Date   CHOLHDL 3 10/04/2018   No results found for: HGBA1C    Assessment & Plan:   Problem List Items Addressed This Visit      Other   Depression with anxiety - Primary   Relevant Medications   FLUoxetine (PROZAC) 40 MG capsule   Other Relevant Orders   Ambulatory referral to Psychology    Other Visit Diagnoses    Irritable bowel syndrome with constipation       Relevant Medications   esomeprazole (NEXIUM) 40 MG capsule   linaclotide (LINZESS) 72 MCG capsule   ondansetron (ZOFRAN) 8 MG tablet   Gastroesophageal reflux disease, esophagitis presence not specified       Relevant Medications   esomeprazole (NEXIUM) 40 MG capsule   linaclotide (LINZESS) 72 MCG capsule   ondansetron (ZOFRAN) 8 MG tablet      Meds ordered this encounter  Medications  . esomeprazole (NEXIUM) 40 MG capsule    Sig: Take 1 capsule (40 mg total) by mouth 2 (two) times daily before a meal.    Dispense:  120 capsule    Refill:  1  . FLUoxetine (PROZAC) 40 MG capsule    Sig: Take 1 capsule (40 mg total) by mouth  daily.    Dispense:  90 capsule    Refill:  1  . linaclotide (LINZESS) 72 MCG capsule    Sig: Take 1 capsule (72 mcg total) by mouth daily before breakfast.    Dispense:  30 capsule    Refill:  2  . ondansetron (ZOFRAN) 8 MG tablet    Sig: Take 1 tablet (8 mg total) by mouth every 8 (eight) hours as needed for nausea or vomiting.    Dispense:  60 tablet    Refill:  1    Follow-up: Return in about 1 month (around 12/04/2018).

## 2018-11-29 ENCOUNTER — Other Ambulatory Visit: Payer: Self-pay | Admitting: Family Medicine

## 2018-11-29 DIAGNOSIS — K581 Irritable bowel syndrome with constipation: Secondary | ICD-10-CM

## 2019-01-09 ENCOUNTER — Other Ambulatory Visit: Payer: Self-pay | Admitting: Gastroenterology

## 2019-02-09 ENCOUNTER — Other Ambulatory Visit: Payer: Self-pay

## 2019-02-16 ENCOUNTER — Other Ambulatory Visit: Payer: Self-pay | Admitting: Family Medicine

## 2019-02-16 DIAGNOSIS — K219 Gastro-esophageal reflux disease without esophagitis: Secondary | ICD-10-CM

## 2019-02-17 ENCOUNTER — Other Ambulatory Visit: Payer: Self-pay | Admitting: Gastroenterology

## 2019-02-17 ENCOUNTER — Other Ambulatory Visit (HOSPITAL_COMMUNITY): Payer: 59

## 2019-02-17 NOTE — Progress Notes (Signed)
Call attempted to complete endo pre-op interview. No answer

## 2019-02-20 ENCOUNTER — Encounter (HOSPITAL_COMMUNITY)
Admission: RE | Disposition: A | Discharge status: Home / Self Care | Payer: Self-pay | Source: Home / Self Care | Attending: Gastroenterology

## 2019-02-20 ENCOUNTER — Ambulatory Visit (HOSPITAL_COMMUNITY)
Admission: RE | Admit: 2019-02-20 | Discharge: 2019-02-20 | Disposition: A | Discharge status: Home / Self Care | Payer: 59 | Attending: Gastroenterology | Admitting: Gastroenterology

## 2019-02-20 ENCOUNTER — Other Ambulatory Visit (HOSPITAL_COMMUNITY)
Admission: RE | Admit: 2019-02-20 | Discharge: 2019-02-20 | Disposition: A | Discharge status: Home / Self Care | Payer: 59 | Source: Ambulatory Visit | Attending: Gastroenterology | Admitting: Gastroenterology

## 2019-02-20 DIAGNOSIS — Z01812 Encounter for preprocedural laboratory examination: Secondary | ICD-10-CM | POA: Diagnosis not present

## 2019-02-20 DIAGNOSIS — K5900 Constipation, unspecified: Secondary | ICD-10-CM | POA: Insufficient documentation

## 2019-02-20 DIAGNOSIS — Z20828 Contact with and (suspected) exposure to other viral communicable diseases: Secondary | ICD-10-CM | POA: Insufficient documentation

## 2019-02-20 HISTORY — PX: ANAL RECTAL MANOMETRY: SHX6358

## 2019-02-20 LAB — SARS CORONAVIRUS 2 BY RT PCR (HOSPITAL ORDER, PERFORMED IN ~~LOC~~ HOSPITAL LAB): SARS Coronavirus 2: NEGATIVE

## 2019-02-20 SURGERY — MANOMETRY, ANORECTAL

## 2019-02-20 NOTE — Progress Notes (Signed)
Anal Manometry done per protocol. Pt tolerated well without distress or complication.  

## 2019-02-21 ENCOUNTER — Encounter (HOSPITAL_COMMUNITY): Payer: Self-pay | Admitting: Gastroenterology

## 2019-02-24 ENCOUNTER — Other Ambulatory Visit (HOSPITAL_COMMUNITY): Admission: RE | Admit: 2019-02-24 | Payer: 59 | Source: Ambulatory Visit

## 2019-05-19 IMAGING — US US ABDOMEN COMPLETE
1 series · 14 of 25 positions shown · non-contrast
Comparison: None.

CLINICAL DATA: Abdominal pain and dyspepsia

EXAM:
ABDOMEN ULTRASOUND COMPLETE

[Series 1: us abdomen complete · 14 of 88 slices shown]
[im 1/88]
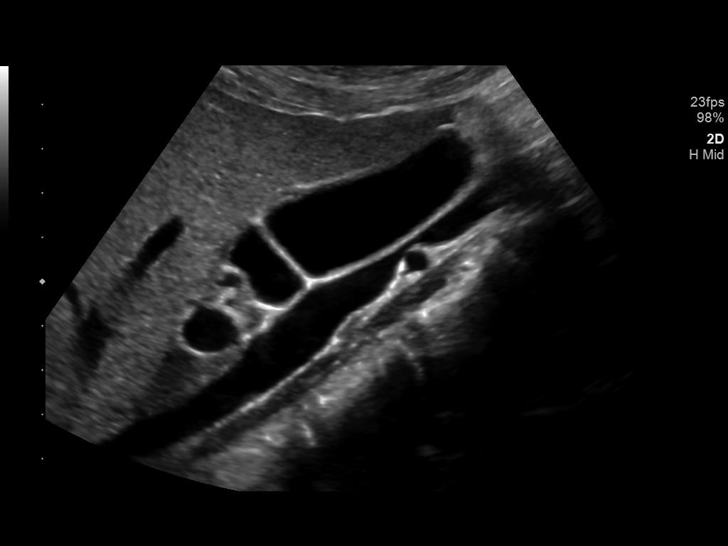
[im 8/88]
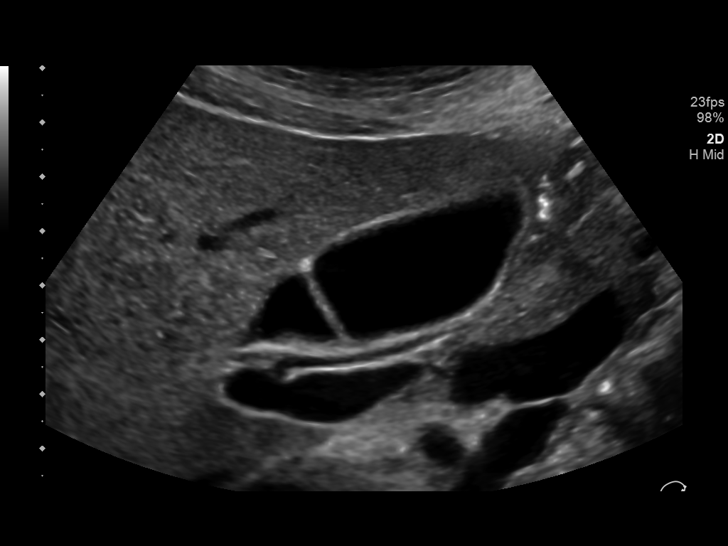
[im 15/88]
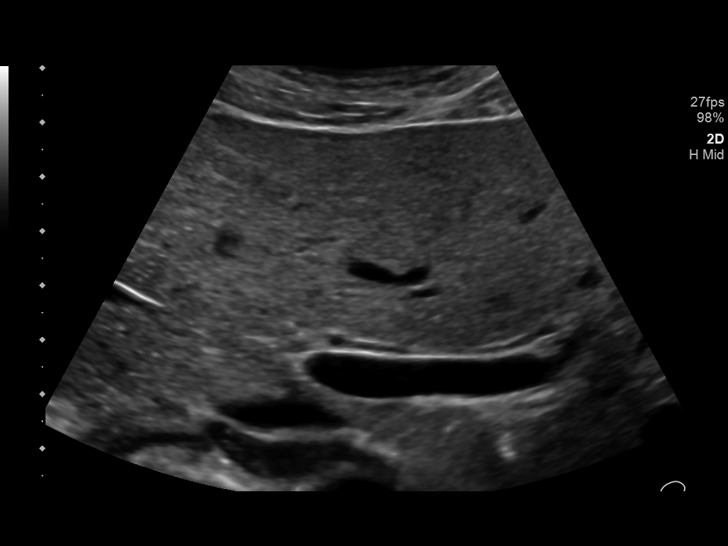
[im 22/88]
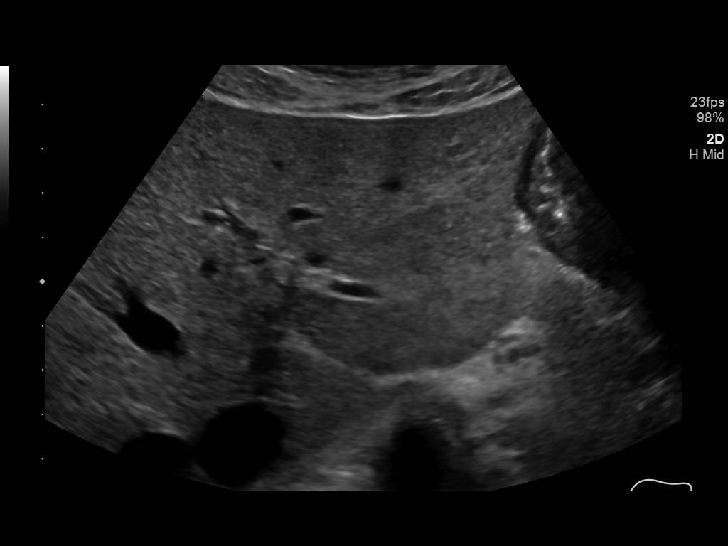
[im 30/88]
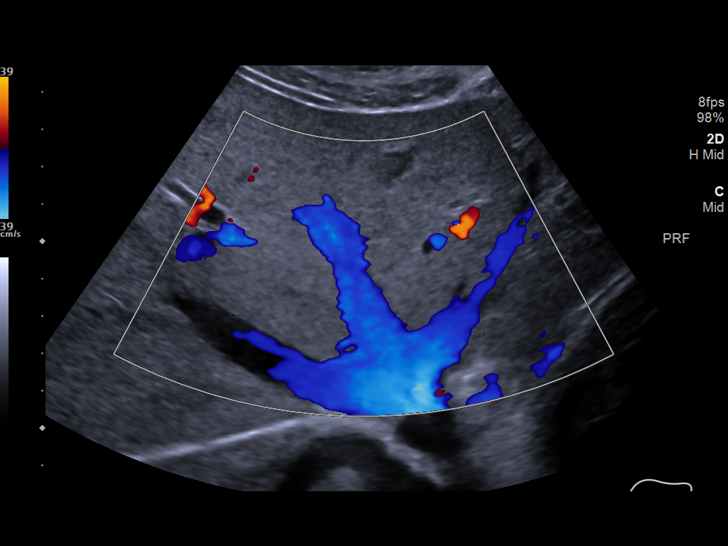
[im 33/88]
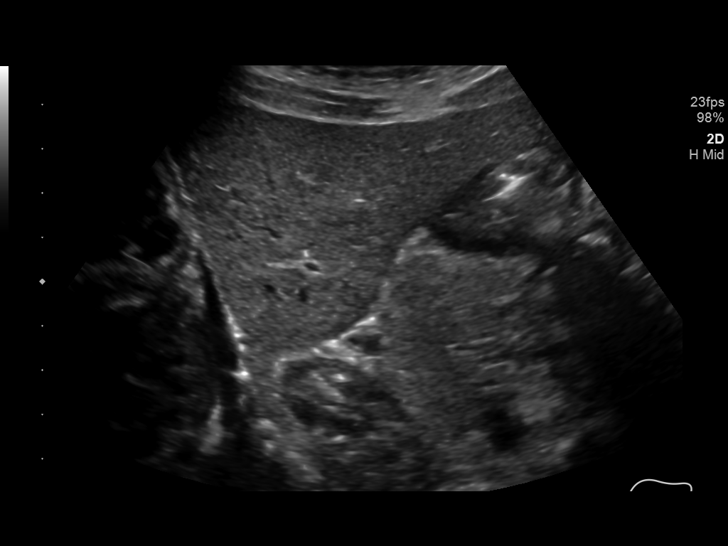
[im 40/88]
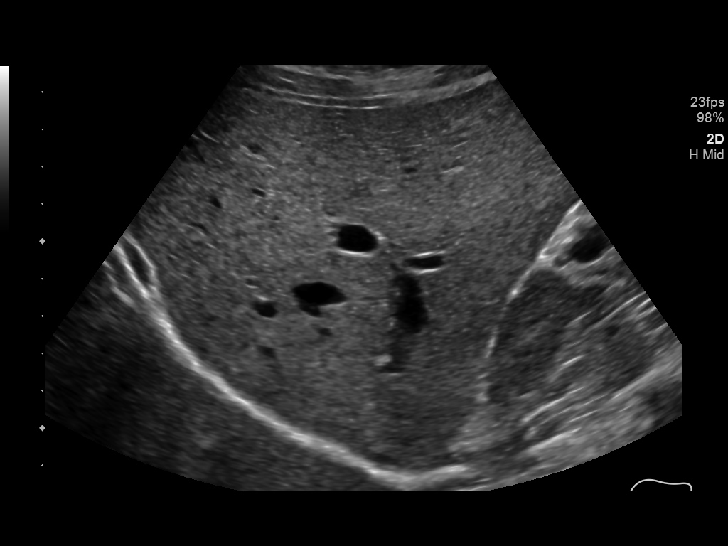
[im 48/88]
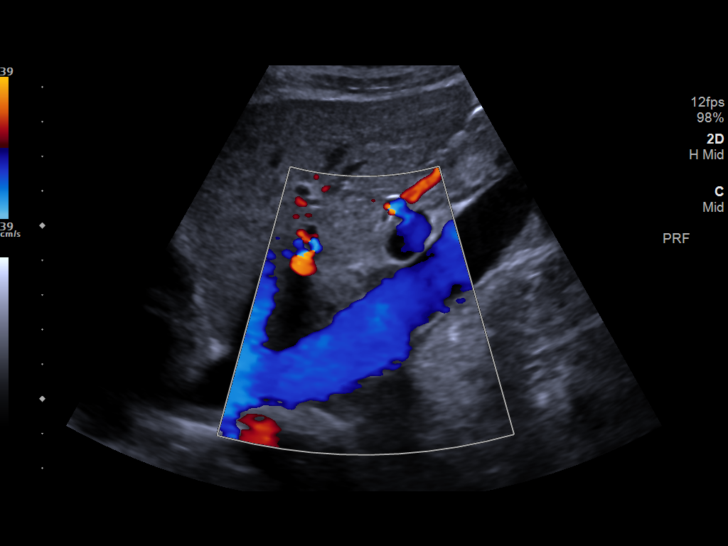
[im 55/88]
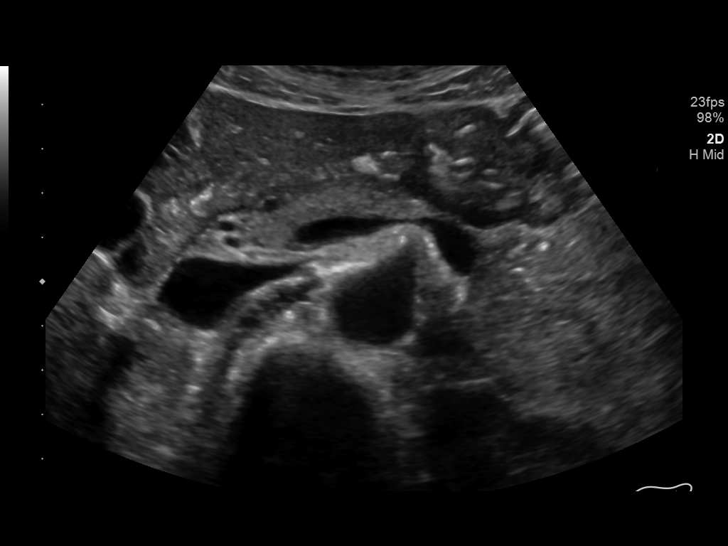
[im 59/88]
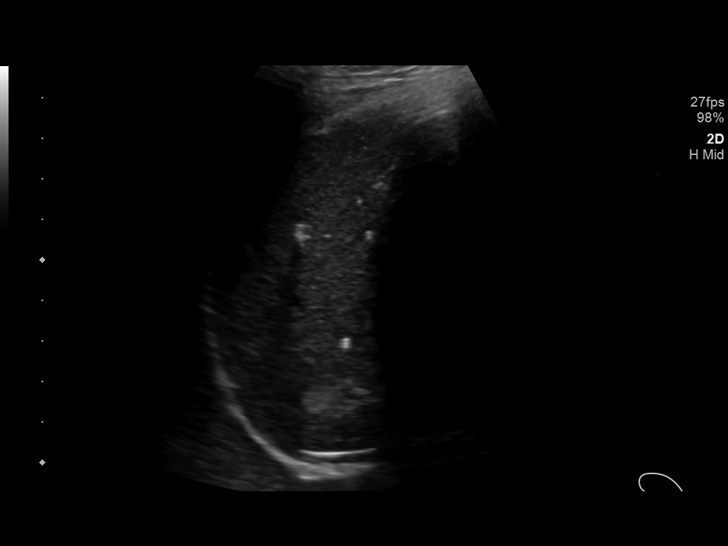
[im 66/88]
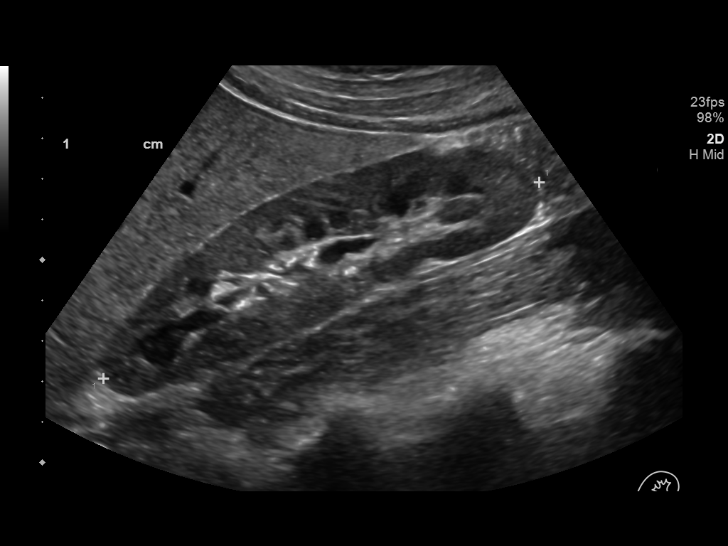
[im 73/88]
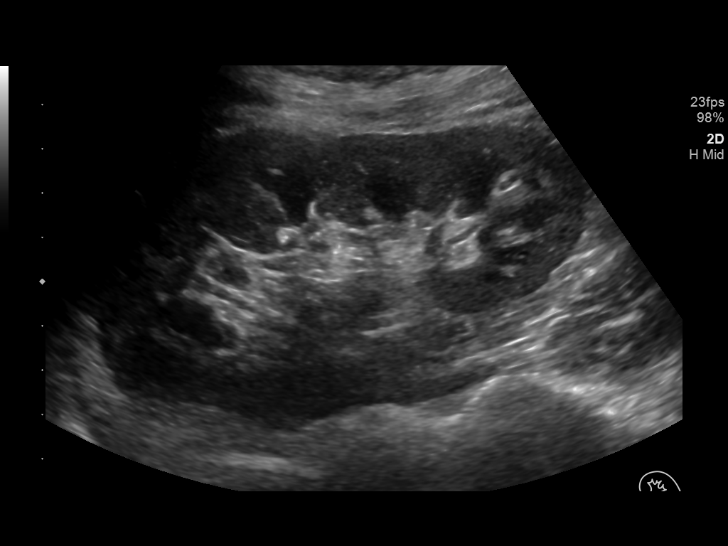
[im 80/88]
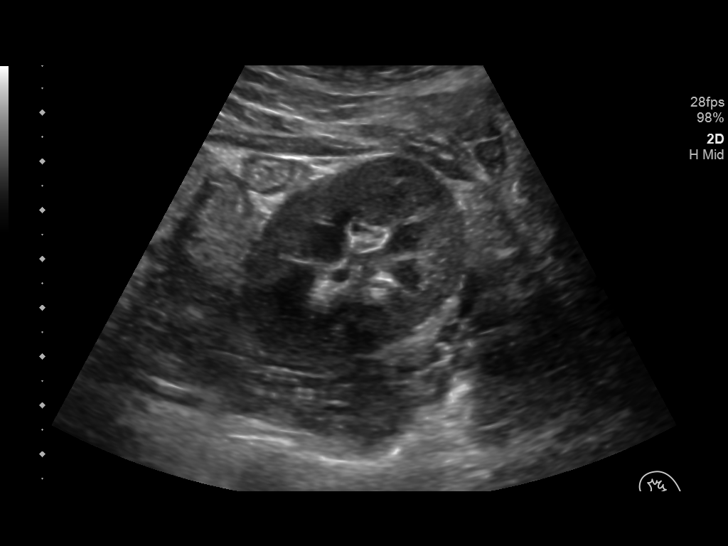
[im 88/88]
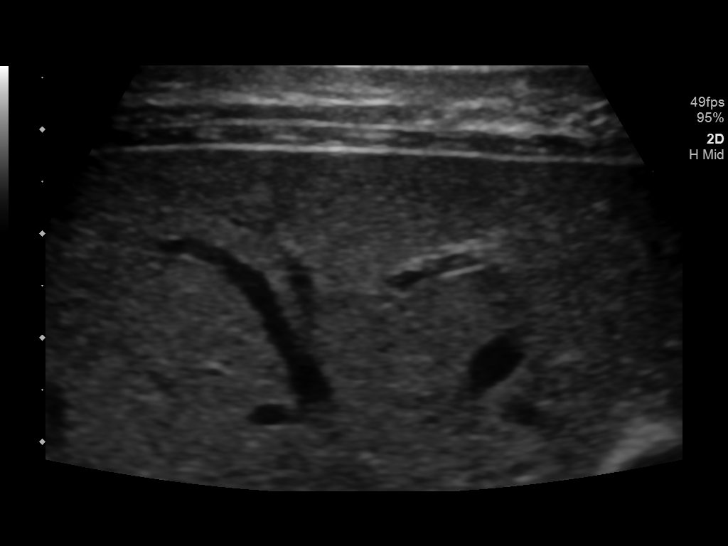

[14 of 25 positions shown; findings below may reference images not displayed]

FINDINGS: Gallbladder: No gallstones or wall thickening visualized. There is
no pericholecystic fluid. No sonographic Murphy sign noted by
sonographer.

Common bile duct: Diameter: 3 mm. No intrahepatic, common hepatic,
or common bile duct dilatation.

Liver: No focal lesion identified. Within normal limits in
parenchymal echogenicity. Portal vein is patent on color Doppler
imaging with normal direction of blood flow towards the liver.

IVC: No abnormality visualized.

Pancreas: No pancreatic mass or inflammatory focus.

Spleen: Size and contour normal. No mass. There are calcified
splenic granulomas.

Right Kidney: Length: 11.8 cm. Echogenicity within normal limits. No
mass or hydronephrosis visualized.

Left Kidney: Length: 11.7 cm. Echogenicity within normal limits. No
mass or hydronephrosis visualized.

Abdominal aorta: No aneurysm visualized.

Other findings: No demonstrable ascites.
IMPRESSION: Calcified splenic granulomas.  Study otherwise unremarkable.

## 2019-06-04 ENCOUNTER — Other Ambulatory Visit: Payer: Self-pay | Admitting: Family Medicine

## 2019-06-04 DIAGNOSIS — F418 Other specified anxiety disorders: Secondary | ICD-10-CM

## 2019-06-06 ENCOUNTER — Other Ambulatory Visit: Payer: Self-pay | Admitting: Family Medicine

## 2019-06-06 DIAGNOSIS — K581 Irritable bowel syndrome with constipation: Secondary | ICD-10-CM

## 2019-09-15 DIAGNOSIS — Z20822 Contact with and (suspected) exposure to covid-19: Secondary | ICD-10-CM | POA: Diagnosis not present

## 2019-09-15 DIAGNOSIS — M791 Myalgia, unspecified site: Secondary | ICD-10-CM | POA: Diagnosis not present

## 2019-10-12 DIAGNOSIS — Z8616 Personal history of COVID-19: Secondary | ICD-10-CM | POA: Diagnosis not present

## 2019-10-22 ENCOUNTER — Other Ambulatory Visit: Payer: Self-pay

## 2019-10-22 ENCOUNTER — Emergency Department (HOSPITAL_COMMUNITY)
Admission: EM | Admit: 2019-10-22 | Discharge: 2019-10-22 | Disposition: A | Discharge status: Home / Self Care | Payer: BC Managed Care – PPO | Attending: Emergency Medicine | Admitting: Emergency Medicine

## 2019-10-22 ENCOUNTER — Emergency Department (HOSPITAL_COMMUNITY): Payer: BC Managed Care – PPO

## 2019-10-22 ENCOUNTER — Encounter (HOSPITAL_COMMUNITY): Payer: Self-pay | Admitting: Emergency Medicine

## 2019-10-22 DIAGNOSIS — F17200 Nicotine dependence, unspecified, uncomplicated: Secondary | ICD-10-CM | POA: Insufficient documentation

## 2019-10-22 DIAGNOSIS — R509 Fever, unspecified: Secondary | ICD-10-CM | POA: Diagnosis not present

## 2019-10-22 DIAGNOSIS — Z79899 Other long term (current) drug therapy: Secondary | ICD-10-CM | POA: Insufficient documentation

## 2019-10-22 DIAGNOSIS — U071 COVID-19: Secondary | ICD-10-CM | POA: Insufficient documentation

## 2019-10-22 DIAGNOSIS — R5383 Other fatigue: Secondary | ICD-10-CM | POA: Diagnosis not present

## 2019-10-22 MED ORDER — ALBUTEROL SULFATE HFA 108 (90 BASE) MCG/ACT IN AERS
2.0000 | INHALATION_SPRAY | Freq: Once | RESPIRATORY_TRACT | Status: AC
  Administered 2019-10-22: 2 via RESPIRATORY_TRACT
  Filled 2019-10-22: qty 6.7

## 2019-10-22 MED ORDER — AEROCHAMBER PLUS FLO-VU MEDIUM MISC
1.0000 | Freq: Once | Status: AC
  Administered 2019-10-22: 14:00:00 1
  Filled 2019-10-22: qty 1

## 2019-10-22 NOTE — ED Triage Notes (Signed)
Patient states that she is still having COVID symptoms, fever and malaise, and would like to know how to get over COVID quicker so she can get back to work.

## 2019-10-22 NOTE — Discharge Instructions (Addendum)
Take 1-2 puffs of the albuterol inhaler every 6 hours as needed for cough or shortness of breath.   Stay well hydrated and get lots of rest.   You should be isolated for at least 7 days since the onset of your symptoms AND >72 hours after symptoms resolution (absence of fever without the use of fever reducing medicaiton and improvement in respiratory symptoms), whichever is longer. Since you are still having fevers and are symptomatic you will need to remain in quarantine until you meet the above criteria.  Please follow up with your primary care provider within 5-7 days for re-evaluation of your symptoms. If you do not have a primary care provider, information for a healthcare clinic has been provided for you to make arrangements for follow up care. Please return to the emergency department for any new or worsening symptoms.

## 2019-10-22 NOTE — ED Provider Notes (Signed)
[facility] DEPT Provider Note   CSN: [id number] Arrival date & time: 10/22/19  1230     History Chief Complaint  Patient presents with  . Fatigue    Cathy Hartman is a 39 y.o. female.  HPI   39 year old female with a history of alcoholism in remission, anxiety/depression, history of anorexia and bulimia, GERD, who presents to the emergency department today for evaluation of persistent Covid symptoms.  States that she became symptomatic of Covid on 10/10/2019 and was diagnosed with Covid on 2/11.  She has had a dry cough throughout this time which has not improved.  She has also had low-grade fevers and some shortness of breath that have not improved.  She does states that the body aches have improved.  She has had no vomiting or diarrhea but does endorse some nausea.  She states that she has completed her 10-day quarantine and is wondering what she can do to make herself feel better in order to go back to work.  Past Medical History:  Diagnosis Date  . Alcoholism in remission ([place])   . Anxiety   . Depression   . Eating disorder    history of anorexia and bulemia  . GERD (gastroesophageal reflux disease)   . IBS (irritable bowel syndrome)     Patient Active Problem List   Diagnosis Date Noted  . Depression with anxiety 10/04/2018  . Healthcare maintenance 10/04/2018  . Drug withdrawal syndrome ([place]) 10/04/2018    Past Surgical History:  Procedure Laterality Date  . ANAL RECTAL MANOMETRY N/A 02/20/2019   Procedure: ANO RECTAL MANOMETRY;  Surgeon: Arta Silence, MD;  Location: [facility] ENDOSCOPY;  Service: Endoscopy;  Laterality: N/A;  . UPPER GASTROINTESTINAL ENDOSCOPY  2010     OB History   No obstetric history on file.     Family History  Problem Relation Age of Onset  . Esophageal cancer Paternal Uncle   . Rectal cancer Neg Hx   . Colon cancer Neg Hx   . Stomach cancer Neg Hx     Social History   Tobacco Use  . Smoking status:  Current Every Day Smoker    Packs/day: 1.00  . Smokeless tobacco: Never Used  Substance Use Topics  . Alcohol use: Not Currently  . Drug use: Never    Home Medications Prior to Admission medications   Medication Sig Start Date End Date Taking? Authorizing Provider  CAMILA 0.35 MG tablet TAKE 1 TABLET BY MOUTH EVERY DAY 10/24/18  Yes Libby Maw, MD  FLUoxetine (PROZAC) 40 MG capsule TAKE 1 CAPSULE BY MOUTH EVERY DAY 06/05/19  Yes Libby Maw, MD  LINZESS 72 MCG capsule TAKE 1 CAPSULE (72 MCG TOTAL) BY MOUTH DAILY BEFORE BREAKFAST. Patient taking differently: Take 72 mcg by mouth daily before breakfast.  06/06/19  Yes Libby Maw, MD  dicyclomine (BENTYL) 10 MG capsule Take 1 capsule (10 mg total) by mouth every 8 (eight) hours as needed for spasms. Patient not taking: Reported on 10/22/2019 08/12/18   Yetta Flock, MD  esomeprazole (NEXIUM) 40 MG capsule TAKE 1 CAPSULE (40 MG TOTAL) BY MOUTH 2 (TWO) TIMES DAILY BEFORE A MEAL. Patient not taking: Reported on 10/22/2019 02/16/19   Libby Maw, MD  ondansetron (ZOFRAN ODT) 4 MG disintegrating tablet Take 1 tablet (4 mg total) by mouth every 6 (six) hours as needed for nausea or vomiting. Patient not taking: Reported on 10/22/2019 10/20/18   Libby Maw, MD  ondansetron [facility]) 8  MG tablet Take 1 tablet (8 mg total) by mouth every 8 (eight) hours as needed for nausea or vomiting. Patient not taking: Reported on 10/22/2019 11/03/18   Libby Maw, MD  polyethylene glycol [facility]) packet Take a double dose, as directed, daily Patient not taking: Reported on 10/22/2019 08/12/18   Yetta Flock, MD  sucralfate (CARAFATE) 1 g tablet Take 1 tablet (1 g total) by mouth every 6 (six) hours as needed. Patient not taking: Reported on 10/22/2019 09/30/18   Armbruster, Carlota Raspberry, MD    Allergies    Patient has no known allergies.  Review of Systems   Review of Systems    Constitutional: Positive for fever.  HENT: Negative for congestion.   Eyes: Negative for visual disturbance.  Respiratory: Positive for cough and shortness of breath.   Cardiovascular: Positive for chest pain ("pressure"). Negative for leg swelling.  Gastrointestinal: Positive for nausea. Negative for abdominal pain, constipation, diarrhea and vomiting.  Genitourinary: Negative for flank pain.  Musculoskeletal: Negative for myalgias.  Skin: Negative for color change.  Neurological: Negative for headaches.    Physical Exam Updated Vital Signs BP 116/83 (BP Location: Left Arm)   Pulse 73   Temp 98.6 F (37 C) (Oral)   Resp 15   SpO2 100%   Physical Exam Vitals and nursing note reviewed.  Constitutional:      General: She is not in acute distress.    Appearance: She is well-developed.  HENT:     Head: Normocephalic and atraumatic.  Eyes:     Conjunctiva/sclera: Conjunctivae normal.  Cardiovascular:     Rate and Rhythm: Normal rate and regular rhythm.     Heart sounds: Normal heart sounds. No murmur.  Pulmonary:     Effort: Pulmonary effort is normal. No respiratory distress.     Breath sounds: Normal breath sounds. No wheezing, rhonchi or rales.  Abdominal:     General: Bowel sounds are normal.     Palpations: Abdomen is soft.     Tenderness: There is no abdominal tenderness. There is no guarding or rebound.  Musculoskeletal:        General: Normal range of motion.     Cervical back: Neck supple.  Skin:    General: Skin is warm and dry.  Neurological:     Mental Status: She is alert.     ED Results / Procedures / Treatments   Labs (all labs ordered are listed, but only abnormal results are displayed) Labs Reviewed - No data to display  EKG None  Radiology DG Chest Portable 1 View  Result Date: 10/22/2019 CLINICAL DATA:  Chest tightness, intermittent fever, COVID positive EXAM: PORTABLE CHEST 1 VIEW COMPARISON:  None. FINDINGS: Lungs are clear.  No pleural  effusion or pneumothorax. The heart is normal in size. IMPRESSION: No evidence of acute cardiopulmonary disease in this patient with known COVID. Electronically Signed   By: Julian Hy M.D.   On: 10/22/2019 13:43    Procedures Procedures (including critical care time)  Medications Ordered in ED Medications  albuterol (VENTOLIN HFA) 108 (90 Base) MCG/ACT inhaler 2 puff (2 puffs Inhalation Given 10/22/19 1338)  AeroChamber Plus Flo-Vu Medium MISC 1 each (1 each Other Given 10/22/19 1339)    ED Course  I have reviewed the triage vital signs and the nursing notes.  Pertinent labs & imaging results that were available during my care of the patient were reviewed by me and considered in my medical decision making (see chart for  details).    MDM Rules/Calculators/A&P                      39 year old female who was recently diagnosed with Covid presenting for evaluation of persistent Covid symptoms.  Vital signs are within normal limits.  Patient's exam is reassuring.  She is in no distress.  Chest x-ray was reviewed and I agree with radiology that there is no evidence of pneumonia, PTX, or other acute abnormality.  Patient given albuterol in the ED and on reassessment she feels somewhat jittery. She remains in no respiratory distress and is speaking in full sentences. Patient nontoxic, well-appearing, no distress.  Vital signs are reassuring. No indication for admission or further testing at this time. Advised on quarantine measures. Will give Rx for symptomatic management. Advised on f/u and return precautions. Pt voiced understanding of the plan and reasons to return. All questions answered, pt stable for d/c.  ---  Fatima Blank was evaluated in Emergency Department on 10/22/2019 for the symptoms described in the history of present illness. She was evaluated in the context of the global COVID-19 pandemic, which necessitated consideration that the patient might be at risk for infection  with the SARS-CoV-2 virus that causes COVID-19. Institutional protocols and algorithms that pertain to the evaluation of patients at risk for COVID-19 are in a state of rapid change based on information released by regulatory bodies including the CDC and federal and state organizations. These policies and algorithms were followed during the patient's care in the ED.  Final Clinical Impression(s) / ED Diagnoses Final diagnoses:  [id number]    Rx / DC Orders ED Discharge Orders    None       Rodney Booze, PA-C 10/22/19 1404    Daleen Bo, MD 10/24/19 1413

## 2019-10-29 DIAGNOSIS — Z8616 Personal history of COVID-19: Secondary | ICD-10-CM | POA: Diagnosis not present

## 2019-11-26 ENCOUNTER — Other Ambulatory Visit: Payer: Self-pay | Admitting: Family Medicine

## 2019-11-26 DIAGNOSIS — F418 Other specified anxiety disorders: Secondary | ICD-10-CM

## 2019-11-29 NOTE — Telephone Encounter (Signed)
Called patient to schedule appointment, no answer LMTCB

## 2019-11-30 NOTE — Telephone Encounter (Signed)
Called pt to schedule appointment no answer LMTCB

## 2019-12-04 NOTE — Telephone Encounter (Signed)
Called pt to schedule follow up on medications, no answer LMTCB

## 2020-02-22 ENCOUNTER — Other Ambulatory Visit: Payer: Self-pay | Admitting: Family Medicine

## 2020-02-22 DIAGNOSIS — F418 Other specified anxiety disorders: Secondary | ICD-10-CM

## 2020-03-18 ENCOUNTER — Other Ambulatory Visit: Payer: Self-pay | Admitting: Family Medicine

## 2020-03-18 DIAGNOSIS — F418 Other specified anxiety disorders: Secondary | ICD-10-CM

## 2020-04-01 ENCOUNTER — Other Ambulatory Visit: Payer: Self-pay | Admitting: Family Medicine

## 2020-04-01 ENCOUNTER — Telehealth: Payer: Self-pay | Admitting: Family Medicine

## 2020-04-01 DIAGNOSIS — F418 Other specified anxiety disorders: Secondary | ICD-10-CM

## 2020-04-01 NOTE — Telephone Encounter (Signed)
Patient is calling and stated that she will be out of Prozac before appointment and wanted to see if Dr. Ethelene Hartman could send her a refill to [facility] in [employer] on [address]. Patient appointment is on 8/13. CB is [phone]

## 2020-04-02 NOTE — Telephone Encounter (Signed)
Last OV 11/03/18 Last fill 03/18/20  #0/0 Pt need a office visit.

## 2020-04-03 NOTE — Telephone Encounter (Signed)
Rx sent in

## 2020-04-12 ENCOUNTER — Encounter: Payer: Self-pay | Admitting: Family Medicine

## 2020-04-12 ENCOUNTER — Telehealth (INDEPENDENT_AMBULATORY_CARE_PROVIDER_SITE_OTHER): Payer: BC Managed Care – PPO | Admitting: Family Medicine

## 2020-04-12 VITALS — Ht 63.0 in

## 2020-04-12 DIAGNOSIS — F418 Other specified anxiety disorders: Secondary | ICD-10-CM | POA: Diagnosis not present

## 2020-04-12 MED ORDER — FLUOXETINE HCL 20 MG PO TABS: 20.0000 mg | ORAL_TABLET | Freq: Every day | ORAL | 1 refills | Status: DC

## 2020-04-12 NOTE — Progress Notes (Signed)
Established Patient Office Visit  Subjective:  Patient ID: Cathy Hartman, female    DOB: Apr 19, 1981  Age: 39 y.o. MRN: [id number]  CC:  Chief Complaint  Patient presents with  . Follow-up    Refill on Prozac no concerns.     HPI Cathy Hartman presents for follow-up for depression and anxiety.  She is in a good place in her life right now.  Marriage is great and there relationship is even better.  Both she and her husband have stable jobs.  They would like [place].  They would like to start a family.  Past Medical History:  Diagnosis Date  . Alcoholism in remission ([place])   . Anxiety   . Depression   . Eating disorder    history of anorexia and bulemia  . GERD (gastroesophageal reflux disease)   . IBS (irritable bowel syndrome)     Past Surgical History:  Procedure Laterality Date  . ANAL RECTAL MANOMETRY N/A 02/20/2019   Procedure: ANO RECTAL MANOMETRY;  Surgeon: Arta Silence, MD;  Location: [facility];  Service: Endoscopy;  Laterality: N/A;  . UPPER GASTROINTESTINAL ENDOSCOPY  2010    Family History  Problem Relation Age of Onset  . Esophageal cancer Paternal Uncle   . Rectal cancer Neg Hx   . Colon cancer Neg Hx   . Stomach cancer Neg Hx     Social History   Socioeconomic History  . Marital status: Married    Spouse name: Not on file  . Number of children: Not on file  . Years of education: Not on file  . Highest education level: Not on file  Occupational History  . Occupation: color stylist   Tobacco Use  . Smoking status: Current Every Day Smoker    Packs/day: 1.00  . Smokeless tobacco: Never Used  Vaping Use  . Vaping Use: Never used  Substance and Sexual Activity  . Alcohol use: Not Currently  . Drug use: Never  . Sexual activity: Yes  Other Topics Concern  . Not on file  Social History Narrative  . Not on file   Social Determinants of Health   Financial Resource Strain:   . Difficulty of Paying Living  Expenses:   Food Insecurity:   . Worried About Charity fundraiser in the Last Year:   . Arboriculturist in the Last Year:   Transportation Needs:   . Film/video editor (Medical):   Marland Kitchen Lack of Transportation (Non-Medical):   Physical Activity:   . Days of Exercise per Week:   . Minutes of Exercise per Session:   Stress:   . Feeling of Stress :   Social Connections:   . Frequency of Communication with Friends and Family:   . Frequency of Social Gatherings with Friends and Family:   . Attends Religious Services:   . Active Member of Clubs or Organizations:   . Attends Archivist Meetings:   Marland Kitchen Marital Status:   Intimate Partner Violence:   . Fear of Current or Ex-Partner:   . Emotionally Abused:   Marland Kitchen Physically Abused:   . Sexually Abused:     Outpatient Medications Prior to Visit  Medication Sig Dispense Refill  . FLUoxetine (PROZAC) 40 MG capsule TAKE 1 CAPSULE BY MOUTH EVERY DAY 90 capsule 1  . CAMILA 0.35 MG tablet TAKE 1 TABLET BY MOUTH EVERY DAY 28 tablet 0  . dicyclomine (BENTYL) 10 MG capsule Take 1  capsule (10 mg total) by mouth every 8 (eight) hours as needed for spasms. (Patient not taking: Reported on 10/22/2019) 30 capsule 3  . esomeprazole (NEXIUM) 40 MG capsule TAKE 1 CAPSULE (40 MG TOTAL) BY MOUTH 2 (TWO) TIMES DAILY BEFORE A MEAL. (Patient not taking: Reported on 10/22/2019) 180 capsule 1  . LINZESS 72 MCG capsule TAKE 1 CAPSULE (72 MCG TOTAL) BY MOUTH DAILY BEFORE BREAKFAST. (Patient taking differently: Take 72 mcg by mouth daily before breakfast. ) 90 capsule 1  . ondansetron (ZOFRAN ODT) 4 MG disintegrating tablet Take 1 tablet (4 mg total) by mouth every 6 (six) hours as needed for nausea or vomiting. (Patient not taking: Reported on 10/22/2019) 30 tablet 3  . ondansetron (ZOFRAN) 8 MG tablet Take 1 tablet (8 mg total) by mouth every 8 (eight) hours as needed for nausea or vomiting. (Patient not taking: Reported on 10/22/2019) 60 tablet 1  . polyethylene  glycol (MIRALAX) packet Take a double dose, as directed, daily (Patient not taking: Reported on 10/22/2019) 14 each 0  . sucralfate (CARAFATE) 1 g tablet Take 1 tablet (1 g total) by mouth every 6 (six) hours as needed. (Patient not taking: Reported on 10/22/2019) 60 tablet 3   No facility-administered medications prior to visit.    No Known Allergies  ROS Review of Systems  Constitutional: Negative.   Respiratory: Negative.   Cardiovascular: Negative.   Gastrointestinal: Negative.   Psychiatric/Behavioral: Negative for dysphoric mood, self-injury, sleep disturbance and suicidal ideas. The patient is not nervous/anxious.        Depression screen [facility] 2/9 04/12/2020 11/03/2018 10/04/2018  Decreased Interest 0 1 2  Down, Depressed, Hopeless 0 1 1  PHQ - 2 Score 0 2 3  Altered sleeping - 0 0  Tired, decreased energy - 3 3  Change in appetite - 1 1  Feeling bad or failure about yourself  - 1 0  Trouble concentrating - 1 1  Moving slowly or fidgety/restless - 1 1  Suicidal thoughts - 0 0  PHQ-9 Score - 9 9  Difficult doing work/chores - - Very difficult    Objective:    Physical Exam Vitals and nursing note reviewed.  Constitutional:      Appearance: Normal appearance.  HENT:     Head: Normocephalic and atraumatic.     Right Ear: External ear normal.     Left Ear: External ear normal.  Eyes:     General: No scleral icterus.       Right eye: No discharge.        Left eye: No discharge.     Conjunctiva/sclera: Conjunctivae normal.  Pulmonary:     Effort: Pulmonary effort is normal.  Neurological:     Mental Status: She is alert and oriented to person, place, and time.  Psychiatric:        Mood and Affect: Mood normal.        Behavior: Behavior normal.     Ht 5\' 3"  (1.6 m)   BMI 21.61 kg/m  Wt Readings from Last 3 Encounters:  11/03/18 122 lb (55.3 kg)  10/04/18 123 lb 2 oz (55.8 kg)  09/02/18 125 lb (56.7 kg)     Health Maintenance Due  Topic Date Due  .  Hepatitis C Screening  Never done  . HIV Screening  Never done  . TETANUS/TDAP  Never done  . INFLUENZA VACCINE  03/31/2020    There are no preventive care reminders to display for this patient.  Lab  Results  Component Value Date   TSH 1.52 08/12/2018   Lab Results  Component Value Date   WBC 6.1 08/12/2018   HGB 15.2 (H) 08/12/2018   HCT 44.6 08/12/2018   MCV 88.8 08/12/2018   PLT 197.0 08/12/2018   Lab Results  Component Value Date   NA 137 08/12/2018   K 4.2 08/12/2018   CO2 25 08/12/2018   GLUCOSE 84 08/12/2018   BUN 5 (L) 08/12/2018   CREATININE 0.69 08/12/2018   BILITOT 0.5 08/12/2018   ALKPHOS 41 08/12/2018   AST 15 08/12/2018   ALT 7 08/12/2018   PROT 7.0 08/12/2018   ALBUMIN 4.6 08/12/2018   CALCIUM 9.5 08/12/2018   GFR 101.08 08/12/2018   Lab Results  Component Value Date   CHOL 209 (H) 10/04/2018   Lab Results  Component Value Date   HDL 63.00 10/04/2018   Lab Results  Component Value Date   LDLCALC 137 (H) 10/04/2018   Lab Results  Component Value Date   TRIG 48.0 10/04/2018   Lab Results  Component Value Date   CHOLHDL 3 10/04/2018   No results found for: HGBA1C    Assessment & Plan:   Problem List Items Addressed This Visit      Other   Depression with anxiety - Primary   Relevant Medications   FLUoxetine (PROZAC) 20 MG tablet      Meds ordered this encounter  Medications  . FLUoxetine (PROZAC) 20 MG tablet    Sig: Take 1 tablet (20 mg total) by mouth daily.    Dispense:  90 tablet    Refill:  1    Follow-up: Return if symptoms worsen or fail to improve.   Patient agrees with moving to a lower dose of Prozac.  Schedule follow-up appointment with her OB.  She is already started prenatal vitamin with extra folate.  We discussed a 1 week taper of Prozac with pregnancy.  We both feel as though it would be easier for her to come off of the lower dose.  Virtual Visit via Video Note  I connected with Cathy Hartman on 04/12/20  at  1:30 PM EDT by a video enabled telemedicine application and verified that I am speaking with the correct person using two identifiers.  Location: Patient: home alone.  Provider:    I discussed the limitations of evaluation and management by telemedicine and the availability of in person appointments. The patient expressed understanding and agreed to proceed.  History of Present Illness:    Observations/Objective:   Assessment and Plan:   Follow Up Instructions:    I discussed the assessment and treatment plan with the patient. The patient was provided an opportunity to ask questions and all were answered. The patient agreed with the plan and demonstrated an understanding of the instructions.   The patient was advised to call back or seek an in-person evaluation if the symptoms worsen or if the condition fails to improve as anticipated.  I provided 30 minutes of non-face-to-face time during this encounter.   Libby Maw, MD  Libby Maw, MD

## 2020-05-31 DIAGNOSIS — Z6822 Body mass index (BMI) 22.0-22.9, adult: Secondary | ICD-10-CM | POA: Diagnosis not present

## 2020-05-31 DIAGNOSIS — Z1329 Encounter for screening for other suspected endocrine disorder: Secondary | ICD-10-CM | POA: Diagnosis not present

## 2020-05-31 DIAGNOSIS — Z01419 Encounter for gynecological examination (general) (routine) without abnormal findings: Secondary | ICD-10-CM | POA: Diagnosis not present

## 2020-06-13 DIAGNOSIS — N979 Female infertility, unspecified: Secondary | ICD-10-CM | POA: Diagnosis not present

## 2021-01-30 DIAGNOSIS — R0789 Other chest pain: Secondary | ICD-10-CM | POA: Diagnosis not present

## 2021-01-30 DIAGNOSIS — R059 Cough, unspecified: Secondary | ICD-10-CM | POA: Diagnosis not present

## 2021-01-30 DIAGNOSIS — J029 Acute pharyngitis, unspecified: Secondary | ICD-10-CM | POA: Diagnosis not present

## 2021-01-30 DIAGNOSIS — J069 Acute upper respiratory infection, unspecified: Secondary | ICD-10-CM | POA: Diagnosis not present

## 2021-01-30 DIAGNOSIS — Z20822 Contact with and (suspected) exposure to covid-19: Secondary | ICD-10-CM | POA: Diagnosis not present

## 2021-01-31 ENCOUNTER — Other Ambulatory Visit: Payer: Self-pay | Admitting: Family Medicine

## 2021-01-31 DIAGNOSIS — F418 Other specified anxiety disorders: Secondary | ICD-10-CM

## 2021-03-12 DIAGNOSIS — N469 Male infertility, unspecified: Secondary | ICD-10-CM | POA: Diagnosis not present

## 2021-03-12 DIAGNOSIS — N979 Female infertility, unspecified: Secondary | ICD-10-CM | POA: Diagnosis not present

## 2021-05-07 DIAGNOSIS — N979 Female infertility, unspecified: Secondary | ICD-10-CM | POA: Diagnosis not present

## 2021-05-13 ENCOUNTER — Other Ambulatory Visit: Payer: Self-pay | Admitting: Family Medicine

## 2021-05-13 DIAGNOSIS — F418 Other specified anxiety disorders: Secondary | ICD-10-CM

## 2021-05-27 ENCOUNTER — Other Ambulatory Visit: Payer: Self-pay | Admitting: Family Medicine

## 2021-05-27 DIAGNOSIS — F418 Other specified anxiety disorders: Secondary | ICD-10-CM

## 2021-05-30 NOTE — Telephone Encounter (Signed)
Spoke to patient and advised to call us back to schedule a follow up appointment before she runs out of meds.  She will call us back on Monday.  Dm/cma

## 2021-06-07 ENCOUNTER — Other Ambulatory Visit: Payer: Self-pay | Admitting: Family Medicine

## 2021-06-07 DIAGNOSIS — F418 Other specified anxiety disorders: Secondary | ICD-10-CM

## 2021-07-04 DIAGNOSIS — F419 Anxiety disorder, unspecified: Secondary | ICD-10-CM | POA: Diagnosis not present

## 2021-07-04 DIAGNOSIS — N979 Female infertility, unspecified: Secondary | ICD-10-CM | POA: Diagnosis not present

## 2021-07-04 DIAGNOSIS — Z01419 Encounter for gynecological examination (general) (routine) without abnormal findings: Secondary | ICD-10-CM | POA: Diagnosis not present

## 2021-07-04 DIAGNOSIS — Z6823 Body mass index (BMI) 23.0-23.9, adult: Secondary | ICD-10-CM | POA: Diagnosis not present

## 2021-07-07 DIAGNOSIS — Z319 Encounter for procreative management, unspecified: Secondary | ICD-10-CM | POA: Diagnosis not present

## 2021-07-07 DIAGNOSIS — Z3169 Encounter for other general counseling and advice on procreation: Secondary | ICD-10-CM | POA: Diagnosis not present

## 2021-07-07 DIAGNOSIS — E288 Other ovarian dysfunction: Secondary | ICD-10-CM | POA: Diagnosis not present

## 2021-07-07 DIAGNOSIS — N979 Female infertility, unspecified: Secondary | ICD-10-CM | POA: Diagnosis not present

## 2021-07-22 DIAGNOSIS — Z319 Encounter for procreative management, unspecified: Secondary | ICD-10-CM | POA: Diagnosis not present

## 2021-07-22 DIAGNOSIS — N979 Female infertility, unspecified: Secondary | ICD-10-CM | POA: Diagnosis not present

## 2021-07-29 DIAGNOSIS — Z3201 Encounter for pregnancy test, result positive: Secondary | ICD-10-CM | POA: Diagnosis not present

## 2021-07-29 DIAGNOSIS — Z3200 Encounter for pregnancy test, result unknown: Secondary | ICD-10-CM | POA: Diagnosis not present

## 2021-07-31 DIAGNOSIS — Z3200 Encounter for pregnancy test, result unknown: Secondary | ICD-10-CM | POA: Diagnosis not present

## 2021-08-07 DIAGNOSIS — Z3200 Encounter for pregnancy test, result unknown: Secondary | ICD-10-CM | POA: Diagnosis not present

## 2021-08-14 DIAGNOSIS — O0900 Supervision of pregnancy with history of infertility, unspecified trimester: Secondary | ICD-10-CM | POA: Diagnosis not present

## 2021-08-21 DIAGNOSIS — O0900 Supervision of pregnancy with history of infertility, unspecified trimester: Secondary | ICD-10-CM | POA: Diagnosis not present

## 2021-08-31 NOTE — L&D Delivery Note (Signed)
Delivery Note At 7:20 PM a viable female was delivered via Vaginal, Spontaneous (Presentation: Left Occiput Anterior).  APGAR: , ; weight pending.  Placenta status: Spontaneous, Intact.  Cord: 3 vessels with the following complications: None.  Cord pH: n/a  Anesthesia: Epidural Episiotomy: None Lacerations: Bilateral periurethral Suture Repair: 3.0 vicryl rapide Est. Blood Loss (mL): 150  Mom to postpartum.  Baby to Couplet care / Skin to Skin.  Linda Hedges 03/25/2022, 7:37 PM

## 2021-09-04 DIAGNOSIS — Z3481 Encounter for supervision of other normal pregnancy, first trimester: Secondary | ICD-10-CM | POA: Diagnosis not present

## 2021-09-04 DIAGNOSIS — Z3685 Encounter for antenatal screening for Streptococcus B: Secondary | ICD-10-CM | POA: Diagnosis not present

## 2021-09-04 LAB — OB RESULTS CONSOLE ABO/RH: RH Type: POSITIVE

## 2021-09-04 LAB — OB RESULTS CONSOLE RUBELLA ANTIBODY, IGM: Rubella: IMMUNE

## 2021-09-04 LAB — HEPATITIS C ANTIBODY: HCV Ab: NEGATIVE

## 2021-09-04 LAB — OB RESULTS CONSOLE HIV ANTIBODY (ROUTINE TESTING): HIV: NONREACTIVE

## 2021-09-04 LAB — OB RESULTS CONSOLE HEPATITIS B SURFACE ANTIGEN: Hepatitis B Surface Ag: NEGATIVE

## 2021-09-04 LAB — OB RESULTS CONSOLE RPR: RPR: NONREACTIVE

## 2021-09-04 LAB — OB RESULTS CONSOLE ANTIBODY SCREEN: Antibody Screen: NEGATIVE

## 2021-09-11 DIAGNOSIS — Z113 Encounter for screening for infections with a predominantly sexual mode of transmission: Secondary | ICD-10-CM | POA: Diagnosis not present

## 2021-09-11 DIAGNOSIS — Z3400 Encounter for supervision of normal first pregnancy, unspecified trimester: Secondary | ICD-10-CM | POA: Diagnosis not present

## 2021-09-11 LAB — OB RESULTS CONSOLE GC/CHLAMYDIA
Chlamydia: NEGATIVE
Neisseria Gonorrhea: NEGATIVE

## 2021-09-22 DIAGNOSIS — Z3A13 13 weeks gestation of pregnancy: Secondary | ICD-10-CM | POA: Diagnosis not present

## 2021-09-22 DIAGNOSIS — O09521 Supervision of elderly multigravida, first trimester: Secondary | ICD-10-CM | POA: Diagnosis not present

## 2021-09-22 DIAGNOSIS — Z3682 Encounter for antenatal screening for nuchal translucency: Secondary | ICD-10-CM | POA: Diagnosis not present

## 2021-10-01 ENCOUNTER — Encounter (HOSPITAL_COMMUNITY): Payer: Self-pay

## 2021-10-01 ENCOUNTER — Inpatient Hospital Stay (HOSPITAL_COMMUNITY)
Admission: AD | Admit: 2021-10-01 | Discharge: 2021-10-01 | Disposition: A | Discharge status: Home / Self Care | Payer: BC Managed Care – PPO | Attending: Obstetrics & Gynecology | Admitting: Obstetrics & Gynecology

## 2021-10-01 ENCOUNTER — Other Ambulatory Visit: Payer: Self-pay

## 2021-10-01 DIAGNOSIS — Z7982 Long term (current) use of aspirin: Secondary | ICD-10-CM | POA: Insufficient documentation

## 2021-10-01 DIAGNOSIS — F17210 Nicotine dependence, cigarettes, uncomplicated: Secondary | ICD-10-CM | POA: Diagnosis not present

## 2021-10-01 DIAGNOSIS — Z3A14 14 weeks gestation of pregnancy: Secondary | ICD-10-CM | POA: Diagnosis not present

## 2021-10-01 DIAGNOSIS — K5900 Constipation, unspecified: Secondary | ICD-10-CM

## 2021-10-01 DIAGNOSIS — O99332 Smoking (tobacco) complicating pregnancy, second trimester: Secondary | ICD-10-CM | POA: Diagnosis not present

## 2021-10-01 DIAGNOSIS — O99612 Diseases of the digestive system complicating pregnancy, second trimester: Secondary | ICD-10-CM | POA: Diagnosis not present

## 2021-10-01 HISTORY — DX: Epigastric pain: R10.13

## 2021-10-01 MED ORDER — SORBITOL 70 % SOLN
960.0000 mL | TOPICAL_OIL | Freq: Once | ORAL | Status: AC
  Administered 2021-10-01: 960 mL via RECTAL
  Filled 2021-10-01 (×3): qty 473

## 2021-10-01 MED ORDER — POLYETHYLENE GLYCOL 3350 17 G PO PACK: 17.0000 g | PACK | Freq: Every day | ORAL | 0 refills | Status: AC

## 2021-10-01 NOTE — MAU Note (Addendum)
...  Cathy Hartman is a 41 y.o. at [redacted]w[redacted]d here in MAU reporting: Patient has been constipated since the beginning of her pregnancy. She states she had one pebble of stool that passed yesterday and prior to that she did a soaps suds enema this past Saturday and only passed "a little debris." She states she is doing twice daily colace and hot prune juice throughout the week. She states then on the weekends she will use a suppository, an enema, and laxatives. Endorses left lower abdominal cramping that has been constant since the beginning of her pregnancy as well. She states yesterday it became hard for her to eat and she felt like it was all going to come up her throat. Has not eaten since 1630 yesterday. Denies VB, abnormal discharge, or LOF.  She has a diagnosis of IBS-C and states prior to pregnancy she was having regular BM's due to a diet change but once her pregnancy started she started eating more carbs and less roughage.  Pain score: 5/10 left lower abdominal  FHT: 145 doppler

## 2021-10-01 NOTE — MAU Provider Note (Signed)
History     CSN: [id number]  Arrival date and time: 10/01/21 6962   Event Date/Time   First Provider Initiated Contact with Patient 10/01/21 [phone]      Chief Complaint  Patient presents with   Constipation   [name] is a 41 y.o. year old G8P0 female at [redacted]w[redacted]d weeks gestation who presents to MAU reporting constipation since the beginning of her pregnancy. She was diagnosed with IBS-C "years ago" by a doctor at [place]. She changed her diet prior to pregnancy and had no other issues with IBS-C. She reports having regular complete BMs before pregnancy. She now take Colace BID and hot prune juice throughout the week. On the weekends, she uses suppositories, enemas and laxatives. When she uses soap suds enemas, she "only gets the liquid back with a small amount of debris in it." She reports LT lower abdominal constant cramping since the beginning of her pregnancy. She reports passing "one pebble" of stool yesterday. She complains of it being hard to eat yesterday since she feels like she is "full of poop up to (her) throat." She receives [facility] with [facility]; next appt is 10/10/2021.   OB History     Gravida  1   Para      Term      Preterm      AB      Living         SAB      IAB      Ectopic      Multiple      Live Births              Past Medical History:  Diagnosis Date   Alcoholism in remission ([place])    Anxiety    Depression    Dyspepsia    Eating disorder    history of anorexia and bulemia   GERD (gastroesophageal reflux disease)    IBS (irritable bowel syndrome)     Past Surgical History:  Procedure Laterality Date   ANAL RECTAL MANOMETRY N/A 02/20/2019   Procedure: ANO RECTAL MANOMETRY;  Surgeon: Arta Silence, MD;  Location: [facility];  Service: Endoscopy;  Laterality: N/A;   UPPER GASTROINTESTINAL ENDOSCOPY  2010    Family History  Problem Relation Age of Onset   Esophageal cancer Paternal Uncle    Rectal cancer Neg Hx     Colon cancer Neg Hx    Stomach cancer Neg Hx     Social History   Tobacco Use   Smoking status: Every Day    Packs/day: 1.00    Types: Cigarettes   Smokeless tobacco: Never  Vaping Use   Vaping Use: Never used  Substance Use Topics   Alcohol use: Not Currently   Drug use: Never    Allergies: No Known Allergies  Medications Prior to Admission  Medication Sig Dispense Refill Last Dose   aspirin EC 81 MG tablet Take 81 mg by mouth daily. Swallow whole.   10/01/2021   docusate sodium (COLACE) 100 MG capsule Take 100 mg by mouth 2 (two) times daily.   09/30/2021   Prenatal Vit-Fe Fumarate-FA (PRENATAL MULTIVITAMIN) TABS tablet Take 1 tablet by mouth daily at 12 noon.   09/30/2021   sertraline (ZOLOFT) 25 MG tablet Take 25 mg by mouth daily.   09/30/2021   FLUoxetine (PROZAC) 40 MG capsule TAKE 1 CAPSULE BY MOUTH EVERY DAY 30 capsule 1     Review of Systems  Constitutional:  Positive for appetite  change ("feels like I am full of poop and I can't eat," last ate at 1630 yesterday).  HENT: Negative.    Eyes: Negative.   Respiratory: Negative.    Cardiovascular: Negative.   Gastrointestinal:  Positive for abdominal pain and constipation (only passing small hard stool or liquid from enema with a small amount of debris in it.).  Endocrine: Negative.   Genitourinary: Negative.   Musculoskeletal: Negative.   Skin: Negative.   Allergic/Immunologic: Negative.   Neurological: Negative.   Hematological: Negative.   Psychiatric/Behavioral: Negative.    Physical Exam   Blood pressure 107/60, pulse 61, temperature 98.1 F (36.7 C), temperature source Oral, resp. rate 17, SpO2 100 %.  Physical Exam Vitals and nursing note reviewed.  Constitutional:      Appearance: Normal appearance. She is normal weight.  Cardiovascular:     Rate and Rhythm: Normal rate and regular rhythm.     Pulses: Normal pulses.     Heart sounds: Normal heart sounds.  Pulmonary:     Effort: Pulmonary effort is  normal.     Breath sounds: Normal breath sounds.  Abdominal:     General: Abdomen is flat. Bowel sounds are decreased. There is no distension.     Palpations: Abdomen is soft. There is no mass.     Tenderness: There is no abdominal tenderness. There is no guarding or rebound.     Hernia: No hernia is present.  Genitourinary:    Comments: Not indicated Musculoskeletal:        General: Normal range of motion.  Skin:    General: Skin is warm and dry.  Neurological:     Mental Status: She is alert and oriented to person, place, and time.  Psychiatric:        Mood and Affect: Mood normal.        Behavior: Behavior normal.        Thought Content: Thought content normal.        Judgment: Judgment normal.   FHTs by doppler: 145 bpm  MAU Course  Procedures  MDM SMOG enema  *Consult with Dr. Pearline Cables in [facility] @ [phone] - notified of patient's complaints, and assessments; recommended tx plan give SMOG enema for faster results   Assessment and Plan  Constipation during pregnancy in second trimester  - Rx for Miralax 17 gm daily - Information provided on constipation and Miralax Clean Out Regimen   [redacted] weeks gestation of pregnancy   - Discharge patient - Keep scheduled appt with [facility] on 10/10/2021 - Patient verbalized an understanding of the plan of care and agrees.    Laury Deep, CNM 10/01/2021, 8:22 AM

## 2021-10-01 NOTE — Discharge Instructions (Signed)

## 2021-11-06 DIAGNOSIS — Z3A19 19 weeks gestation of pregnancy: Secondary | ICD-10-CM | POA: Diagnosis not present

## 2021-11-06 DIAGNOSIS — Z363 Encounter for antenatal screening for malformations: Secondary | ICD-10-CM | POA: Diagnosis not present

## 2022-01-07 DIAGNOSIS — Z3402 Encounter for supervision of normal first pregnancy, second trimester: Secondary | ICD-10-CM | POA: Diagnosis not present

## 2022-01-17 ENCOUNTER — Inpatient Hospital Stay (HOSPITAL_COMMUNITY)
Admission: AD | Admit: 2022-01-17 | Discharge: 2022-01-17 | Disposition: A | Discharge status: Home / Self Care | Payer: BC Managed Care – PPO | Attending: Obstetrics and Gynecology | Admitting: Obstetrics and Gynecology

## 2022-01-17 ENCOUNTER — Encounter (HOSPITAL_COMMUNITY): Payer: Self-pay | Admitting: Obstetrics and Gynecology

## 2022-01-17 ENCOUNTER — Other Ambulatory Visit: Payer: Self-pay

## 2022-01-17 DIAGNOSIS — W19XXXA Unspecified fall, initial encounter: Secondary | ICD-10-CM

## 2022-01-17 DIAGNOSIS — S3982XA Other specified injuries of lower back, initial encounter: Secondary | ICD-10-CM | POA: Diagnosis not present

## 2022-01-17 DIAGNOSIS — O26893 Other specified pregnancy related conditions, third trimester: Secondary | ICD-10-CM | POA: Diagnosis not present

## 2022-01-17 DIAGNOSIS — W091XXA Fall from playground swing, initial encounter: Secondary | ICD-10-CM | POA: Insufficient documentation

## 2022-01-17 DIAGNOSIS — S3992XA Unspecified injury of lower back, initial encounter: Secondary | ICD-10-CM

## 2022-01-17 DIAGNOSIS — Z3A29 29 weeks gestation of pregnancy: Secondary | ICD-10-CM | POA: Insufficient documentation

## 2022-01-17 MED ORDER — CYCLOBENZAPRINE HCL 10 MG PO TABS
10.0000 mg | ORAL_TABLET | Freq: Two times a day (BID) | ORAL | 0 refills | Status: DC | PRN
Start: 2022-01-17 — End: 2022-03-26

## 2022-01-17 NOTE — MAU Provider Note (Signed)
History     CSN: [id number]  Arrival date and time: 01/17/22 1448   Event Date/Time   First Provider Initiated Contact with Patient 01/17/22 1545      Chief Complaint  Patient presents with   Fall   HPI Cathy Hartman is a 41 y.o. G1P0 at 33w6dwho presents with tailbone pain after a fall. She states she sat on an old wood swing that broke and she landed on her bottom. She denies any trauma to her abdomen. She denies any bleeding or leaking. She reports normal fetal movement. She just reports feeling sore in her tailbone and wanted to be sure everything is ok.   OB History     Gravida  1   Para      Term      Preterm      AB      Living         SAB      IAB      Ectopic      Multiple      Live Births              Past Medical History:  Diagnosis Date   Alcoholism in remission ([place]    Anxiety    Depression    Dyspepsia    Eating disorder    history of anorexia and bulemia   GERD (gastroesophageal reflux disease)    IBS (irritable bowel syndrome)     Past Surgical History:  Procedure Laterality Date   ANAL RECTAL MANOMETRY N/A 02/20/2019   Procedure: ANO RECTAL MANOMETRY;  Surgeon: OArta Silence MD;  [facility];  Service: Endoscopy;  Laterality: N/A;   UPPER GASTROINTESTINAL ENDOSCOPY  2010    Family History  Problem Relation Age of Onset   Esophageal cancer Paternal Uncle    Rectal cancer Neg Hx    Colon cancer Neg Hx    Stomach cancer Neg Hx     Social History   Tobacco Use   Smoking status: Every Day    Packs/day: 1.00    Types: Cigarettes   Smokeless tobacco: Never  Vaping Use   Vaping Use: Never used  Substance Use Topics   Alcohol use: Not Currently   Drug use: Never    Allergies: No Known Allergies  Medications Prior to Admission  Medication Sig Dispense Refill Last Dose   aspirin EC 81 MG tablet Take 81 mg by mouth daily. Swallow whole.   01/17/2022   docusate sodium (COLACE) 100 MG capsule Take 100 mg by  mouth 2 (two) times daily.      FLUoxetine (PROZAC) 40 MG capsule TAKE 1 CAPSULE BY MOUTH EVERY DAY 30 capsule 1    polyethylene glycol (MIRALAX / GLYCOLAX) 17 g packet Take 17 g by mouth daily. 30 each 0    Prenatal Vit-Fe Fumarate-FA (PRENATAL MULTIVITAMIN) TABS tablet Take 1 tablet by mouth daily at 12 noon.      sertraline (ZOLOFT) 25 MG tablet Take 25 mg by mouth daily.       Review of Systems  Constitutional: Negative.  Negative for fatigue and fever.  HENT: Negative.    Respiratory: Negative.  Negative for shortness of breath.   Cardiovascular: Negative.  Negative for chest pain.  Gastrointestinal: Negative.  Negative for abdominal pain, constipation, diarrhea, nausea and vomiting.  Genitourinary: Negative.  Negative for dysuria, vaginal bleeding and vaginal discharge.  Musculoskeletal:        Tailbone pain  Neurological: Negative.  Negative for  dizziness and headaches.  Physical Exam   Blood pressure 110/63, pulse 84, temperature 98 F (36.7 C), temperature source Oral, resp. rate 18, height 5' 3.5" (1.613 m), weight 74.8 kg, SpO2 98 %.  Physical Exam Vitals and nursing note reviewed.  Constitutional:      General: She is not in acute distress.    Appearance: She is well-developed.  HENT:     Head: Normocephalic.  Eyes:     Pupils: Pupils are equal, round, and reactive to light.  Cardiovascular:     Rate and Rhythm: Normal rate and regular rhythm.     Heart sounds: Normal heart sounds.  Pulmonary:     Effort: Pulmonary effort is normal. No respiratory distress.     Breath sounds: Normal breath sounds.  Abdominal:     General: Bowel sounds are normal. There is no distension.     Palpations: Abdomen is soft.     Tenderness: There is no abdominal tenderness.  Skin:    General: Skin is warm and dry.  Neurological:     Mental Status: She is alert and oriented to person, place, and time.  Psychiatric:        Mood and Affect: Mood normal.        Behavior: Behavior  normal.        Thought Content: Thought content normal.        Judgment: Judgment normal.   Fetal Tracing:  Baseline: 135 Variability: moderate Accels: 15x15 Decels: none  Toco: none  MAU Course  Procedures  MDM Reactive fetal monitoring without any evidence of uterine activity Reassurance provided Offered pain medication and patient declines any in hospital but would like some to take home in case she is sore tomorrow.  Assessment and Plan   1. Fall, initial encounter   2. Injury of coccyx, initial encounter   3. [redacted] weeks gestation of pregnancy    -Discharge home in stable condition -Rx for flexeril -Third trimester precautions discussed -Patient advised to follow-up with OB as scheduled for prenatal care -Patient may return to MAU as needed or if her condition were to change or worsen   [place] 01/17/2022, 3:45 PM

## 2022-01-17 NOTE — MAU Note (Signed)
Cathy Hartman is a 41 y.o. at 40w6dhere in MAU reporting: fell today @ 1345.  Reports landed on tailbone, didn't strike abdomen.  Endorses +FM since fall. Denies VB or LOF.  Onset of complaint: 1345 Pain score: 6/10 There were no vitals filed for this visit.   FHT:147 bpm Lab orders placed from triage:

## 2022-01-17 NOTE — Discharge Instructions (Signed)

## 2022-03-02 DIAGNOSIS — Z3685 Encounter for antenatal screening for Streptococcus B: Secondary | ICD-10-CM | POA: Diagnosis not present

## 2022-03-02 LAB — OB RESULTS CONSOLE GBS: GBS: POSITIVE

## 2022-03-06 ENCOUNTER — Ambulatory Visit
Admission: EM | Admit: 2022-03-06 | Discharge: 2022-03-06 | Disposition: A | Discharge status: Home / Self Care | Payer: BC Managed Care – PPO | Attending: Internal Medicine | Admitting: Internal Medicine

## 2022-03-06 DIAGNOSIS — O219 Vomiting of pregnancy, unspecified: Secondary | ICD-10-CM

## 2022-03-06 MED ORDER — PROMETHAZINE HCL 25 MG PO TABS: 25.0000 mg | ORAL_TABLET | Freq: Three times a day (TID) | ORAL | 0 refills | Status: DC | PRN

## 2022-03-06 NOTE — ED Triage Notes (Signed)
Pt presents with nausea and reflux X 3 days.

## 2022-03-06 NOTE — ED Provider Notes (Signed)
[facility]    CSN: [id number] Arrival date & time: 03/06/22  1115      History   Chief Complaint Chief Complaint  Patient presents with   Nausea    HPI Cathy Hartman is a 41 y.o. female currently 9 months pregnant comes to urgent care with a 2-day history of persistent nausea without vomiting.  She denies any abdominal pain.  No known relieving factors.  No diarrhea.  No sick contacts.  Fetal movements are unchanged.  Patient could not see her OB/GYN today hence the visit to the urgent care.  Next appointment is next Thursday.   HPI  Past Medical History:  Diagnosis Date   Alcoholism in remission ([place])    Anxiety    Depression    Dyspepsia    Eating disorder    history of anorexia and bulemia   GERD (gastroesophageal reflux disease)    IBS (irritable bowel syndrome)     Patient Active Problem List   Diagnosis Date Noted   Depression with anxiety 10/04/2018   Healthcare maintenance 10/04/2018   Drug withdrawal syndrome ([place]) 10/04/2018    Past Surgical History:  Procedure Laterality Date   ANAL RECTAL MANOMETRY N/A 02/20/2019   Procedure: ANO RECTAL MANOMETRY;  Surgeon: Arta Silence, MD;  Location: [facility];  Service: Endoscopy;  Laterality: N/A;   UPPER GASTROINTESTINAL ENDOSCOPY  2010    OB History     Gravida  1   Para      Term      Preterm      AB      Living         SAB      IAB      Ectopic      Multiple      Live Births               Home Medications    Prior to Admission medications   Medication Sig Start Date End Date Taking? Authorizing Provider  promethazine (PHENERGAN) 25 MG tablet Take 1 tablet (25 mg total) by mouth every 8 (eight) hours as needed for nausea or vomiting. 03/06/22  Yes Tavish Gettis, Myrene Galas, MD  aspirin EC 81 MG tablet Take 81 mg by mouth daily. Swallow whole.    [provider]  cyclobenzaprine (FLEXERIL) 10 MG tablet Take 1 tablet (10 mg total) by mouth 2 (two) times daily as  needed for muscle spasms. 01/17/22   Wende Mott, CNM  docusate sodium (COLACE) 100 MG capsule Take 100 mg by mouth 2 (two) times daily.    [provider]  FLUoxetine (PROZAC) 40 MG capsule TAKE 1 CAPSULE BY MOUTH EVERY DAY 05/28/21   Libby Maw, MD  polyethylene glycol (MIRALAX / GLYCOLAX) 17 g packet Take 17 g by mouth daily. 10/01/21   Laury Deep, CNM  Prenatal Vit-Fe Fumarate-FA (PRENATAL MULTIVITAMIN) TABS tablet Take 1 tablet by mouth daily at 12 noon.    [provider]  sertraline (ZOLOFT) 25 MG tablet Take 25 mg by mouth daily.    [provider]    Family History Family History  Problem Relation Age of Onset   Esophageal cancer Paternal Uncle    Rectal cancer Neg Hx    Colon cancer Neg Hx    Stomach cancer Neg Hx     Social History Social History   Tobacco Use   Smoking status: Every Day    Packs/day: 1.00    Types: Cigarettes   Smokeless  tobacco: Never  Vaping Use   Vaping Use: Never used  Substance Use Topics   Alcohol use: Not Currently   Drug use: Never     Allergies   Patient has no known allergies.   Review of Systems Review of Systems  HENT: Negative.    Gastrointestinal:  Positive for nausea. Negative for abdominal pain.     Physical Exam Triage Vital Signs ED Triage Vitals  Enc Vitals Group     BP 03/06/22 1142 110/71     Pulse Rate 03/06/22 1142 71     Resp 03/06/22 1142 18     Temp 03/06/22 1142 98.2 F (36.8 C)     Temp Source 03/06/22 1142 Oral     SpO2 03/06/22 1142 97 %     Weight --      Height --      Head Circumference --      Peak Flow --      Pain Score 03/06/22 1141 0     Pain Loc --      Pain Edu? --      Excl. in [place]? --    No data found.  Updated Vital Signs BP 110/71 (BP Location: Left Arm)   Pulse 71   Temp 98.2 F (36.8 C) (Oral)   Resp 18   SpO2 97%   Visual Acuity Right Eye Distance:   Left Eye Distance:   Bilateral Distance:    Right Eye Near:   Left  Eye Near:    Bilateral Near:     Physical Exam Vitals and nursing note reviewed.  Cardiovascular:     Rate and Rhythm: Normal rate and regular rhythm.     Pulses: Normal pulses.     Heart sounds: Normal heart sounds.  Abdominal:     General: There is distension.     Tenderness: There is no abdominal tenderness. There is no guarding.  Neurological:     Mental Status: She is alert.      UC Treatments / Results  Labs (all labs ordered are listed, but only abnormal results are displayed) Labs Reviewed - No data to display  EKG   Radiology No results found.  Procedures Procedures (including critical care time)  Medications Ordered in UC Medications - No data to display  Initial Impression / Assessment and Plan / UC Course  I have reviewed the triage vital signs and the nursing notes.  Pertinent labs & imaging results that were available during my care of the patient were reviewed by me and considered in my medical decision making (see chart for details).     1.  Nausea in pregnancy: Phenergan 25 mg every 6 hours as needed for nausea/vomiting Increase oral fluid intake Keep your appointments with your OB. Final Clinical Impressions(s) / UC Diagnoses   Final diagnoses:  Nausea and vomiting in pregnancy     Discharge Instructions      Maintain adequate oral fluid intake Take medications as prescribed Follow-up with your OB/GYN if you have any other concerns.   ED Prescriptions     Medication Sig Dispense Auth. Provider   promethazine (PHENERGAN) 25 MG tablet Take 1 tablet (25 mg total) by mouth every 8 (eight) hours as needed for nausea or vomiting. 15 tablet Cyara Devoto, Myrene Galas, MD      PDMP not reviewed this encounter.   Chase Picket, MD 03/06/22 1316

## 2022-03-06 NOTE — Discharge Instructions (Addendum)
Maintain adequate oral fluid intake Take medications as prescribed Follow-up with your OB/GYN if you have any other concerns.

## 2022-03-23 ENCOUNTER — Telehealth (HOSPITAL_COMMUNITY): Payer: Self-pay | Admitting: *Deleted

## 2022-03-24 ENCOUNTER — Other Ambulatory Visit: Payer: Self-pay

## 2022-03-24 NOTE — Telephone Encounter (Signed)
Preadmission screen  

## 2022-03-25 ENCOUNTER — Inpatient Hospital Stay (HOSPITAL_COMMUNITY): Payer: BC Managed Care – PPO | Admitting: Anesthesiology

## 2022-03-25 ENCOUNTER — Inpatient Hospital Stay (HOSPITAL_COMMUNITY)
Admission: AD | Admit: 2022-03-25 | Discharge: 2022-03-26 | DRG: 807 | Disposition: A | Discharge status: Home / Self Care | Payer: BC Managed Care – PPO | Attending: Obstetrics & Gynecology | Admitting: Obstetrics & Gynecology

## 2022-03-25 ENCOUNTER — Encounter (HOSPITAL_COMMUNITY): Payer: Self-pay | Admitting: Obstetrics & Gynecology

## 2022-03-25 ENCOUNTER — Inpatient Hospital Stay (HOSPITAL_COMMUNITY): Payer: BC Managed Care – PPO

## 2022-03-25 DIAGNOSIS — Z3A39 39 weeks gestation of pregnancy: Secondary | ICD-10-CM | POA: Diagnosis not present

## 2022-03-25 DIAGNOSIS — Z3490 Encounter for supervision of normal pregnancy, unspecified, unspecified trimester: Secondary | ICD-10-CM

## 2022-03-25 DIAGNOSIS — F17210 Nicotine dependence, cigarettes, uncomplicated: Secondary | ICD-10-CM | POA: Diagnosis present

## 2022-03-25 DIAGNOSIS — O26893 Other specified pregnancy related conditions, third trimester: Secondary | ICD-10-CM | POA: Diagnosis not present

## 2022-03-25 DIAGNOSIS — O99334 Smoking (tobacco) complicating childbirth: Secondary | ICD-10-CM | POA: Diagnosis not present

## 2022-03-25 DIAGNOSIS — F419 Anxiety disorder, unspecified: Secondary | ICD-10-CM | POA: Diagnosis present

## 2022-03-25 DIAGNOSIS — F32A Depression, unspecified: Secondary | ICD-10-CM | POA: Diagnosis present

## 2022-03-25 DIAGNOSIS — F418 Other specified anxiety disorders: Secondary | ICD-10-CM | POA: Diagnosis not present

## 2022-03-25 DIAGNOSIS — O99824 Streptococcus B carrier state complicating childbirth: Secondary | ICD-10-CM | POA: Diagnosis present

## 2022-03-25 DIAGNOSIS — O99344 Other mental disorders complicating childbirth: Secondary | ICD-10-CM | POA: Diagnosis not present

## 2022-03-25 LAB — CBC
HCT: 38.9 % (ref 36.0–46.0)
Hemoglobin: 13.5 g/dL (ref 12.0–15.0)
MCH: 31.5 pg (ref 26.0–34.0)
MCHC: 34.7 g/dL (ref 30.0–36.0)
MCV: 90.9 fL (ref 80.0–100.0)
Platelets: 171 10*3/uL (ref 150–400)
RBC: 4.28 MIL/uL (ref 3.87–5.11)
RDW: 14.6 % (ref 11.5–15.5)
WBC: 9.3 10*3/uL (ref 4.0–10.5)
nRBC: 0 % (ref 0.0–0.2)

## 2022-03-25 LAB — TYPE AND SCREEN
ABO/RH(D): O POS
Antibody Screen: NEGATIVE

## 2022-03-25 LAB — RPR: RPR Ser Ql: NONREACTIVE

## 2022-03-25 MED ORDER — EPHEDRINE 5 MG/ML INJ: 10.0000 mg | INTRAVENOUS | Status: DC | PRN

## 2022-03-25 MED ORDER — DIPHENHYDRAMINE HCL 25 MG PO CAPS: 25.0000 mg | ORAL_CAPSULE | Freq: Four times a day (QID) | ORAL | Status: DC | PRN

## 2022-03-25 MED ORDER — WITCH HAZEL-GLYCERIN EX PADS
1.0000 | MEDICATED_PAD | CUTANEOUS | Status: DC | PRN
Start: 2022-03-25 — End: 2022-03-27

## 2022-03-25 MED ORDER — ONDANSETRON HCL 4 MG/2ML IJ SOLN
4.0000 mg | Freq: Four times a day (QID) | INTRAMUSCULAR | Status: DC | PRN
  Administered 2022-03-25: 4 mg via INTRAVENOUS
  Filled 2022-03-25: qty 2

## 2022-03-25 MED ORDER — OXYTOCIN-SODIUM CHLORIDE 30-0.9 UT/500ML-% IV SOLN
2.5000 [IU]/h | INTRAVENOUS | Status: DC
  Administered 2022-03-25: 2.5 [IU]/h via INTRAVENOUS
  Filled 2022-03-25: qty 500

## 2022-03-25 MED ORDER — LACTATED RINGERS IV SOLN: 500.0000 mL | INTRAVENOUS | Status: DC | PRN

## 2022-03-25 MED ORDER — ONDANSETRON HCL 4 MG/2ML IJ SOLN: 4.0000 mg | INTRAMUSCULAR | Status: DC | PRN

## 2022-03-25 MED ORDER — OXYTOCIN BOLUS FROM INFUSION
333.0000 mL | Freq: Once | INTRAVENOUS | Status: AC
  Administered 2022-03-25: 333 mL via INTRAVENOUS

## 2022-03-25 MED ORDER — TERBUTALINE SULFATE 1 MG/ML IJ SOLN: 0.2500 mg | Freq: Once | INTRAMUSCULAR | Status: DC | PRN

## 2022-03-25 MED ORDER — SOD CITRATE-CITRIC ACID 500-334 MG/5ML PO SOLN: 30.0000 mL | ORAL | Status: DC | PRN

## 2022-03-25 MED ORDER — TETANUS-DIPHTH-ACELL PERTUSSIS 5-2.5-18.5 LF-MCG/0.5 IM SUSY: 0.5000 mL | PREFILLED_SYRINGE | Freq: Once | INTRAMUSCULAR | Status: DC

## 2022-03-25 MED ORDER — PHENYLEPHRINE 80 MCG/ML (10ML) SYRINGE FOR IV PUSH (FOR BLOOD PRESSURE SUPPORT): 80.0000 ug | PREFILLED_SYRINGE | INTRAVENOUS | Status: DC | PRN

## 2022-03-25 MED ORDER — COCONUT OIL OIL: 1.0000 | TOPICAL_OIL | Status: DC | PRN

## 2022-03-25 MED ORDER — SENNOSIDES-DOCUSATE SODIUM 8.6-50 MG PO TABS
2.0000 | ORAL_TABLET | Freq: Every day | ORAL | Status: DC
  Administered 2022-03-26: 2 via ORAL
  Filled 2022-03-25: qty 2

## 2022-03-25 MED ORDER — OXYCODONE-ACETAMINOPHEN 5-325 MG PO TABS: 2.0000 | ORAL_TABLET | ORAL | Status: DC | PRN

## 2022-03-25 MED ORDER — SODIUM CHLORIDE 0.9 % IV SOLN
5.0000 10*6.[IU] | Freq: Once | INTRAVENOUS | Status: AC
  Administered 2022-03-25: 5 10*6.[IU] via INTRAVENOUS
  Filled 2022-03-25: qty 5

## 2022-03-25 MED ORDER — DIBUCAINE (PERIANAL) 1 % EX OINT: 1.0000 | TOPICAL_OINTMENT | CUTANEOUS | Status: DC | PRN

## 2022-03-25 MED ORDER — ACETAMINOPHEN 325 MG PO TABS
650.0000 mg | ORAL_TABLET | ORAL | Status: DC | PRN
  Administered 2022-03-25 – 2022-03-26 (×2): 650 mg via ORAL
  Filled 2022-03-25 (×2): qty 2

## 2022-03-25 MED ORDER — ACETAMINOPHEN 325 MG PO TABS: 650.0000 mg | ORAL_TABLET | ORAL | Status: DC | PRN

## 2022-03-25 MED ORDER — IBUPROFEN 600 MG PO TABS
600.0000 mg | ORAL_TABLET | Freq: Four times a day (QID) | ORAL | Status: DC
  Administered 2022-03-26 (×4): 600 mg via ORAL
  Filled 2022-03-25 (×4): qty 1

## 2022-03-25 MED ORDER — LIDOCAINE-EPINEPHRINE (PF) 2 %-1:200000 IJ SOLN
INTRAMUSCULAR | Status: DC | PRN
  Administered 2022-03-25: 5 mL via EPIDURAL

## 2022-03-25 MED ORDER — ZOLPIDEM TARTRATE 5 MG PO TABS: 5.0000 mg | ORAL_TABLET | Freq: Every evening | ORAL | Status: DC | PRN

## 2022-03-25 MED ORDER — LIDOCAINE HCL (PF) 1 % IJ SOLN: 30.0000 mL | INTRAMUSCULAR | Status: DC | PRN

## 2022-03-25 MED ORDER — SIMETHICONE 80 MG PO CHEW: 80.0000 mg | CHEWABLE_TABLET | ORAL | Status: DC | PRN

## 2022-03-25 MED ORDER — ONDANSETRON HCL 4 MG PO TABS: 4.0000 mg | ORAL_TABLET | ORAL | Status: DC | PRN

## 2022-03-25 MED ORDER — PHENYLEPHRINE 80 MCG/ML (10ML) SYRINGE FOR IV PUSH (FOR BLOOD PRESSURE SUPPORT)
80.0000 ug | PREFILLED_SYRINGE | INTRAVENOUS | Status: DC | PRN
  Filled 2022-03-25: qty 10

## 2022-03-25 MED ORDER — SERTRALINE HCL 25 MG PO TABS
25.0000 mg | ORAL_TABLET | Freq: Every day | ORAL | Status: DC
  Administered 2022-03-26: 25 mg via ORAL
  Filled 2022-03-25: qty 1

## 2022-03-25 MED ORDER — MISOPROSTOL 25 MCG QUARTER TABLET
25.0000 ug | ORAL_TABLET | ORAL | Status: DC | PRN
  Administered 2022-03-25 (×3): 25 ug via VAGINAL
  Filled 2022-03-25 (×3): qty 1

## 2022-03-25 MED ORDER — FENTANYL-BUPIVACAINE-NACL 0.5-0.125-0.9 MG/250ML-% EP SOLN
12.0000 mL/h | EPIDURAL | Status: DC | PRN
  Administered 2022-03-25: 12 mL/h via EPIDURAL
  Filled 2022-03-25: qty 250

## 2022-03-25 MED ORDER — LACTATED RINGERS IV SOLN
500.0000 mL | Freq: Once | INTRAVENOUS | Status: AC
Start: 2022-03-25 — End: 2022-03-25
  Administered 2022-03-25: 500 mL via INTRAVENOUS

## 2022-03-25 MED ORDER — FENTANYL CITRATE (PF) 100 MCG/2ML IJ SOLN
50.0000 ug | INTRAMUSCULAR | Status: DC | PRN
  Administered 2022-03-25 (×2): 100 ug via INTRAVENOUS
  Administered 2022-03-25: 50 ug via INTRAVENOUS
  Filled 2022-03-25 (×3): qty 2

## 2022-03-25 MED ORDER — BENZOCAINE-MENTHOL 20-0.5 % EX AERO
1.0000 | INHALATION_SPRAY | CUTANEOUS | Status: DC | PRN
Start: 2022-03-25 — End: 2022-03-27

## 2022-03-25 MED ORDER — PRENATAL MULTIVITAMIN CH
1.0000 | ORAL_TABLET | Freq: Every day | ORAL | Status: DC
  Administered 2022-03-26: 1 via ORAL
  Filled 2022-03-25: qty 1

## 2022-03-25 MED ORDER — PENICILLIN G POT IN DEXTROSE 60000 UNIT/ML IV SOLN
3.0000 10*6.[IU] | INTRAVENOUS | Status: DC
  Administered 2022-03-25 (×2): 3 10*6.[IU] via INTRAVENOUS
  Filled 2022-03-25 (×2): qty 50

## 2022-03-25 MED ORDER — OXYCODONE-ACETAMINOPHEN 5-325 MG PO TABS: 1.0000 | ORAL_TABLET | ORAL | Status: DC | PRN

## 2022-03-25 MED ORDER — LACTATED RINGERS IV SOLN: INTRAVENOUS | Status: DC

## 2022-03-25 MED ORDER — DIPHENHYDRAMINE HCL 50 MG/ML IJ SOLN: 12.5000 mg | INTRAMUSCULAR | Status: DC | PRN

## 2022-03-25 NOTE — Anesthesia Preprocedure Evaluation (Signed)
Anesthesia Evaluation  Patient identified by MRN, date of birth, ID band Patient awake    Reviewed: Allergy & Precautions, NPO status , Patient's Chart, lab work & pertinent test results  Airway Mallampati: II  TM Distance: >3 FB Neck ROM: Full    Dental no notable dental hx.    Pulmonary Current Smoker and Patient abstained from smoking.,    Pulmonary exam normal breath sounds clear to auscultation       Cardiovascular negative cardio ROS Normal cardiovascular exam Rhythm:Regular Rate:Normal     Neuro/Psych PSYCHIATRIC DISORDERS Anxiety Depression negative neurological ROS     GI/Hepatic GERD  ,(+)     substance abuse  alcohol use,   Endo/Other  negative endocrine ROS  Renal/GU negative Renal ROS  negative genitourinary   Musculoskeletal negative musculoskeletal ROS (+)   Abdominal   Peds  Hematology negative hematology ROS (+)   Anesthesia Other Findings IOL for AMA  Reproductive/Obstetrics (+) Pregnancy                             Anesthesia Physical Anesthesia Plan  ASA: 2  Anesthesia Plan: Epidural   Post-op Pain Management:    Induction:   PONV Risk Score and Plan: Treatment may vary due to age or medical condition  Airway Management Planned: Natural Airway  Additional Equipment:   Intra-op Plan:   Post-operative Plan:   Informed Consent: I have reviewed the patients History and Physical, chart, labs and discussed the procedure including the risks, benefits and alternatives for the proposed anesthesia with the patient or authorized representative who has indicated his/her understanding and acceptance.       Plan Discussed with: Anesthesiologist  Anesthesia Plan Comments: (Patient identified. Risks, benefits, options discussed with patient including but not limited to bleeding, infection, nerve damage, paralysis, failed block, incomplete pain control, headache,  blood pressure changes, nausea, vomiting, reactions to medication, itching, and post partum back pain. Confirmed with bedside nurse the patient's most recent platelet count. Confirmed with the patient that they are not taking any anticoagulation, have any bleeding history or any family history of bleeding disorders. Patient expressed understanding and wishes to proceed. All questions were answered. )        Anesthesia Quick Evaluation

## 2022-03-25 NOTE — Lactation Note (Signed)
This note was copied from a baby's chart. Lactation Consultation Note  Patient Name: Cathy Hartman OINOM'V Date: 03/25/2022 Reason for consult: L&D Initial assessment;Mother's request;Difficult latch;1st time breastfeeding;Term;Breastfeeding assistance;Other (Comment) (Sertraline) Age:42 hours  Infant licking and tasting colostrum not able to latch. Infant left STS with parents..  Drops of colostrum fed to infant on spoon.  Maternal Data Has patient been taught Hand Expression?: Yes Does the patient have breastfeeding experience prior to this delivery?: No  Feeding Mother's Current Feeding Choice: Breast Milk  LATCH Score Latch: Repeated attempts needed to sustain latch, nipple held in mouth throughout feeding, stimulation needed to elicit sucking reflex.  Audible Swallowing: None  Type of Nipple: Flat  Comfort (Breast/Nipple): Soft / non-tender  Hold (Positioning): Assistance needed to correctly position infant at breast and maintain latch.  LATCH Score: 5   Lactation Tools Discussed/Used    Interventions Interventions: Breast feeding basics reviewed;Assisted with latch;Skin to skin;Breast massage;Hand express;Breast compression;Adjust position;Expressed milk;Education  Discharge    Consult Status Consult Status: Follow-up from L&D Date: 03/26/22 Follow-up type: In-patient    Cathy Hartman  [name] 03/25/2022, 8:22 PM

## 2022-03-25 NOTE — H&P (Signed)
Cathy Hartman is a 41 y.o. female G1 at 73w3dpresenting for IOL secondary to [place]  S/P second misoprostol dose at 0556; feeling rare mild CTX.  Patient has h/o anxiety and depression; taking sertraline 25 mg daily.  GBS positive.    OB History     Gravida  1   Para      Term      Preterm      AB      Living         SAB      IAB      Ectopic      Multiple      Live Births             Past Medical History:  Diagnosis Date   [place]    Anxiety    Depression    Dyspepsia    Eating disorder    history of anorexia and bulemia   GERD (gastroesophageal reflux disease)    IBS (irritable bowel syndrome)    Past Surgical History:  Procedure Laterality Date   ANAL RECTAL MANOMETRY N/A 02/20/2019   Procedure: ANO RECTAL MANOMETRY;  Surgeon: OArta Silence MD;  Location: [facility];  Service: Endoscopy;  Laterality: N/A;   UPPER GASTROINTESTINAL ENDOSCOPY  2010   Family History: family history includes Esophageal cancer in her paternal uncle. Social History:  reports that she has been smoking cigarettes. She has been smoking an average of 1 pack per day. She has never used smokeless tobacco. She reports that she does not currently use alcohol. She reports that she does not use drugs.     Maternal Diabetes: No Genetic Screening: Normal Maternal Ultrasounds/Referrals: Normal Fetal Ultrasounds or other Referrals:  None Maternal Substance Abuse:  No Significant Maternal Medications:  Meds include: Zoloft Significant Maternal Lab Results:  Group B Strep positive Number of Prenatal Visits:greater than 3 verified prenatal visits Other Comments:  None  Review of Systems Maternal Medical History:  Fetal activity: Perceived fetal activity is normal.   Last perceived fetal movement was within the past hour.   Prenatal complications: no prenatal complications Prenatal Complications - Diabetes: none.   Dilation: 1.5 Effacement (%):  50 Station: -3 Exam by:: AChief Financial OfficerBlood pressure 109/61, pulse 70, temperature 98.5 F (36.9 C), temperature source Oral, height 5' 3.5" (1.613 m), weight 78.7 kg. Maternal Exam:  Uterine Assessment: Contraction strength is mild.  Contraction frequency is rare.  Abdomen: Patient reports no abdominal tenderness. Fundal height is c/w dates.   Estimated fetal weight is 7#4.     Fetal Exam Fetal Monitor Review: Baseline rate: 130.  Variability: moderate (6-25 bpm).   Pattern: accelerations present and no decelerations.   Fetal State Assessment: Category I - tracings are normal.   Physical Exam Constitutional:      Appearance: Normal appearance.  HENT:     Head: Normocephalic and atraumatic.  Pulmonary:     Effort: Pulmonary effort is normal.  Abdominal:     Palpations: Abdomen is soft.  Musculoskeletal:        General: Normal range of motion.     Cervical back: Normal range of motion.  Skin:    General: Skin is warm and dry.  Neurological:     Mental Status: She is alert and oriented to person, place, and time.  Psychiatric:        Mood and Affect: Mood normal.        Behavior: Behavior normal.  Prenatal labs: ABO, Rh: --/--/O POS (07/26 0101) Antibody: NEG (07/26 0101) Rubella: Immune (01/05 0000) RPR: NON REACTIVE (07/26 0101)  HBsAg: Negative (01/05 0000)  HIV: Non-reactive (01/05 0000)  GBS: Positive/-- (07/03 0000)   Assessment/Plan: 40yo G1 at 26w3dfor IOL -Recheck cvx at 1000 -Patient eating salad on rounds this AM.  I counseled patient for clear liquid diet here on out -PCN for GBS ppx -Anticipate NSVD -Anx/Dep-sertraline ordered    MLinda Hedges7/26/2023, 8:07 AM

## 2022-03-25 NOTE — Progress Notes (Signed)
Cathy Hartman is a 41 y.o. G1P0 at 43w3dby ultrasound [facility]  Subjective: Comfortable with CLEA  Objective: BP 123/69   Pulse 68   Temp 97.8 F (36.6 C) (Oral)   Resp 16   Ht 5' 3.5" (1.613 m)   Wt 78.7 kg   SpO2 98%   BMI 30.23 kg/m  No intake/output data recorded. No intake/output data recorded.  FHT:  FHR: 120 bpm, variability: moderate,  accelerations:  Present,  decelerations:  Absent UC:   regular, every 2-3 minutes SVE:   5/90/+1  Labs: Lab Results  Component Value Date   WBC 9.3 03/25/2022   HGB 13.5 03/25/2022   HCT 38.9 03/25/2022   MCV 90.9 03/25/2022   PLT 171 03/25/2022    Assessment / Plan: Induction of labor, progressing well s/p VMP x 3  Labor: Progressing normally Preeclampsia:   n/a Fetal Wellbeing:  Category I Pain Control:  Epidural I/D:  n/a Anticipated MOD:  NSVD  MLinda Hedges DO 03/25/2022, 2:33 PM

## 2022-03-25 NOTE — Progress Notes (Signed)
Cathy Hartman is a 41 y.o. G1P0 at 45w3dby ultrasound [facility]  Subjective: Feeling CTX with increasing strength  Objective: BP 109/61   Pulse 70   Temp 98.5 F (36.9 C) (Oral)   Resp 18   Ht 5' 3.5" (1.613 m)   Wt 78.7 kg   BMI 30.23 kg/m  No intake/output data recorded. No intake/output data recorded.  FHT:  FHR: 150 bpm, variability: moderate,  accelerations:  Present,  decelerations:  Absent UC:   irregular, every 2-4 minutes SVE:   1/50/-2, VMP #3 placed  Labs: Lab Results  Component Value Date   WBC 9.3 03/25/2022   HGB 13.5 03/25/2022   HCT 38.9 03/25/2022   MCV 90.9 03/25/2022   PLT 171 03/25/2022    Assessment / Plan: IOL progressing well  Labor: Progressing normally Preeclampsia:   n/a Fetal Wellbeing:  Category I Pain Control:  Labor support without medications I/D:  n/a Anticipated MOD:  NSVD  MLinda Hedges DO 03/25/2022, 9:57 AM

## 2022-03-25 NOTE — Progress Notes (Signed)
Cathy Hartman is a 41 y.o. G1P0 at 74w3dby ultrasound [facility]  Subjective: Comfortable with CLEA  Objective: BP 111/68   Pulse 68   Temp 98 F (36.7 C) (Oral)   Resp 16   Ht 5' 3.5" (1.613 m)   Wt 78.7 kg   SpO2 98%   BMI 30.23 kg/m  No intake/output data recorded. No intake/output data recorded.  FHT:  FHR: 120 bpm, variability: moderate,  accelerations:  Abscent,  decelerations:  Present early UC:   regular, every 2-3 minutes SVE:   Dilation: 9 Effacement (%): 100 Station: Plus 1, Plus 2 Exam by:: Dr. MLynnette Caffey Labs: Lab Results  Component Value Date   WBC 9.3 03/25/2022   HGB 13.5 03/25/2022   HCT 38.9 03/25/2022   MCV 90.9 03/25/2022   PLT 171 03/25/2022    Assessment / Plan: Induction of labor, progressing well  Labor: Progressing normally Preeclampsia:   n/a Fetal Wellbeing:  Category I Pain Control:  Epidural I/D:  n/a Anticipated MOD:  NSVD  MLinda Hedges DO 03/25/2022, 5:52 PM

## 2022-03-25 NOTE — Anesthesia Procedure Notes (Signed)
Epidural Patient location during procedure: OB Start time: 03/25/2022 1:25 PM End time: 03/25/2022 1:35 PM  Staffing Anesthesiologist: Freddrick March, MD Performed: anesthesiologist   Preanesthetic Checklist Completed: patient identified, IV checked, risks and benefits discussed, monitors and equipment checked, pre-op evaluation and timeout performed  Epidural Patient position: sitting Prep: DuraPrep and site prepped and draped Patient monitoring: continuous pulse ox, blood pressure, heart rate and cardiac monitor Approach: midline Location: L3-L4 Injection technique: LOR air  Needle:  Needle type: Tuohy  Needle gauge: 17 G Needle length: 9 cm Needle insertion depth: 5.5 cm Catheter type: closed end flexible Catheter size: 19 Gauge Catheter at skin depth: 11 cm Test dose: negative  Assessment Sensory level: T8 Events: blood not aspirated, injection not painful, no injection resistance, no paresthesia and negative IV test  Additional Notes Patient identified. Risks/Benefits/Options discussed with patient including but not limited to bleeding, infection, nerve damage, paralysis, failed block, incomplete pain control, headache, blood pressure changes, nausea, vomiting, reactions to medication both or allergic, itching and postpartum back pain. Confirmed with bedside nurse the patient's most recent platelet count. Confirmed with patient that they are not currently taking any anticoagulation, have any bleeding history or any family history of bleeding disorders. Patient expressed understanding and wished to proceed. All questions were answered. Sterile technique was used throughout the entire procedure. Please see nursing notes for vital signs. Test dose was given through epidural catheter and negative prior to continuing to dose epidural or start infusion. Warning signs of high block given to the patient including shortness of breath, tingling/numbness in hands, complete motor block,  or any concerning symptoms with instructions to call for help. Patient was given instructions on fall risk and not to get out of bed. All questions and concerns addressed with instructions to call with any issues or inadequate analgesia.  Reason for block:procedure for pain

## 2022-03-25 NOTE — Progress Notes (Signed)
SVE: c/c/+2 Start to [place], DO

## 2022-03-26 LAB — CBC
HCT: 34.8 % — ABNORMAL LOW (ref 36.0–46.0)
Hemoglobin: 12.1 g/dL (ref 12.0–15.0)
MCH: 31.6 pg (ref 26.0–34.0)
MCHC: 34.8 g/dL (ref 30.0–36.0)
MCV: 90.9 fL (ref 80.0–100.0)
Platelets: 142 10*3/uL — ABNORMAL LOW (ref 150–400)
RBC: 3.83 MIL/uL — ABNORMAL LOW (ref 3.87–5.11)
RDW: 14.2 % (ref 11.5–15.5)
WBC: 10.9 10*3/uL — ABNORMAL HIGH (ref 4.0–10.5)
nRBC: 0 % (ref 0.0–0.2)

## 2022-03-26 NOTE — Progress Notes (Signed)
Post Partum Day 1 Subjective: no complaints, up ad lib, voiding, and tolerating PO  Objective: Blood pressure (!) 107/59, pulse 70, temperature 98.2 F (36.8 C), temperature source Oral, resp. rate 16, height 5' 3.5" (1.613 m), weight 78.7 kg, SpO2 99 %, unknown if currently breastfeeding.  Physical Exam:  General: alert Lochia: appropriate Uterine Fundus: firm Incision: NA DVT Evaluation: No evidence of DVT seen on physical exam.  Recent Labs    03/25/22 0101 03/26/22 0421  HGB 13.5 12.1  HCT 38.9 34.8*    Assessment/Plan: Meeting all goals. Desires d/c today.   LOS: 1 day   Tyson Dense, MD 03/26/2022, 8:27 AM

## 2022-03-26 NOTE — Discharge Summary (Signed)
Postpartum Discharge Summary      Patient Name: Cathy Hartman DOB: 05-05-1981 MRN: [id number]  Date of admission: 03/25/2022 Delivery date:03/25/2022  Delivering provider: Linda Hedges  Date of discharge: 03/26/2022  Admitting diagnosis: Pregnancy [Z34.90] Intrauterine pregnancy: [redacted]w[redacted]d    Secondary diagnosis:  Principal Problem:   Pregnancy  Additional problems: none    Discharge diagnosis: Term Pregnancy Delivered                                              Post partum procedures: None Augmentation: AROM, Pitocin, and Cytotec Complications: None  Hospital course: Induction of Labor With Vaginal Delivery   41y.o. yo G1P1001 at 320w3das admitted to the hospital 03/25/2022 for induction of labor.  Indication for induction: AMA.  Patient had an uncomplicated labor course as follows: Membrane Rupture Time/Date: 2:00 PM ,03/25/2022   Delivery Method:Vaginal, Spontaneous  Episiotomy: None  Lacerations:  1st degree;Periurethral  Details of delivery can be found in separate delivery note.  Patient had a routine postpartum course. Patient is discharged home 03/26/22.  Newborn Data: Birth date:03/25/2022  Birth time:7:20 PM  Gender:Female  Living status:Living  Apgars:6 ,8  Weight:3360 g   Magnesium Sulfate received: No BMZ received: No Rhophylac:N/A MMR:N/A T-DaP:Given prenatally Flu: N/A Transfusion:No  Physical exam  Vitals:   03/25/22 2030 03/25/22 2130 03/25/22 2303 03/26/22 0316  BP: 103/71 117/76 107/69 (!) 107/59  Pulse: 95 67 81 70  Resp:  _0 Temp:  98.4 F (36.9 C) 99.3 F (37.4 C) 98.2 F (36.8 C)  TempSrc:  Axillary Oral Oral  SpO2:  99% 99%   Weight:      Height:       General: alert, cooperative, and no distress Lochia: appropriate Uterine Fundus: firm Incision: N/A DVT Evaluation: No evidence of DVT seen on physical exam. Labs: Lab Results  Component Value Date   WBC 10.9 (H) 03/26/2022   HGB 12.1 03/26/2022   HCT 34.8 (L)  03/26/2022   MCV 90.9 03/26/2022   PLT 142 (L) 03/26/2022      Latest Ref Rng & Units 08/12/2018    9:29 AM  CMP  Glucose 70 - 99 mg/dL 84   BUN 6 - 23 mg/dL 5   Creatinine 0.40 - 1.20 mg/dL 0.69   Sodium 135 - 145 mEq/L 137   Potassium 3.5 - 5.1 mEq/L 4.2   Chloride 96 - 112 mEq/L 102   CO2 19 - 32 mEq/L 25   Calcium 8.4 - 10.5 mg/dL 9.5   Total Protein 6.0 - 8.3 g/dL 7.0   Total Bilirubin 0.2 - 1.2 mg/dL 0.5   Alkaline Phos 39 - 117 U/L 41   AST 0 - 37 U/L 15   ALT 0 - 35 U/L 7    Edinburgh Score:     No data to display            After visit meds:  Allergies as of 03/26/2022   No Known Allergies      Medication List     STOP taking these medications    aspirin EC 81 MG tablet   cyclobenzaprine 10 MG tablet Commonly known as: FLEXERIL   FLUoxetine 40 MG capsule Commonly known as: PROZAC   promethazine 25 MG tablet Commonly known as: PHENERGAN       TAKE these medications  docusate sodium 100 MG capsule Commonly known as: COLACE Take 100 mg by mouth 2 (two) times daily.   polyethylene glycol 17 g packet Commonly known as: MIRALAX / GLYCOLAX Take 17 g by mouth daily.   prenatal multivitamin Tabs tablet Take 1 tablet by mouth daily at 12 noon.   sertraline 25 MG tablet Commonly known as: ZOLOFT Take 25 mg by mouth daily.         Discharge home in stable condition Infant Feeding: Bottle and Breast Infant Disposition:home with mother Discharge instruction: per After Visit Summary and Postpartum booklet. Activity: Advance as tolerated. Pelvic rest for 6 weeks.  Diet: routine diet Anticipated Birth Control: Unsure Postpartum Appointment:6 weeks Additional Postpartum F/U:  None Future Appointments:No future appointments. Follow up Visit:      03/26/2022 Tyson Dense, MD

## 2022-03-26 NOTE — Social Work (Signed)
MOB was referred for history of depression/anxiety.  * Referral screened out by Clinical Social Worker because none of the following criteria appear to apply:  ~ History of anxiety/depression during this pregnancy, or of post-partum depression following prior delivery.  ~ Diagnosis of anxiety and/or depression within last 3 years OR * MOB's symptoms currently being treated with medication and/or therapy.  Per chart review pt is being treated with Sertraline, stable mood.   Please contact the Clinical Social Worker if needs arise, by [facility] request, or if MOB scores greater than 9/yes to question 10 on Edinburgh Postpartum Depression Screen.  [place] Social Worker [phone]

## 2022-03-26 NOTE — Lactation Note (Signed)
This note was copied from a baby's chart. Lactation Consultation Note  Patient Name: Girl Murl Zogg [id number] Date: 03/26/2022 Reason for consult: Initial assessment;Primapara;Term Age:42 hours  Mom holding infant in blankets.  Several questions regarding feeding, how long, how to know baby is transferring ect... All questions addressed.   Mom is able to hand express, baby placed STS, and mom latched in cross cradle hold on right side. Swallows heard with stimulation to baby and gentle breast compressions.   Baby sustained latch well.     Mom encouraged to call out for assistance if needed.  Resource sheet given, and mom is aware of [facility], OP services and phone line.   Maternal Data Has patient been taught Hand Expression?: Yes Does the patient have breastfeeding experience prior to this delivery?: No  Feeding Mother's Current Feeding Choice: Breast Milk  LATCH Score Latch: Grasps breast easily, tongue down, lips flanged, rhythmical sucking.  Audible Swallowing: A few with stimulation  Type of Nipple: Everted at rest and after stimulation  Comfort (Breast/Nipple): Soft / non-tender  Hold (Positioning): Assistance needed to correctly position infant at breast and maintain latch.  LATCH Score: 8   Lactation Tools Discussed/Used    Interventions Interventions: Breast feeding basics reviewed;Skin to skin;Assisted with latch;Hand express;Position options;Support pillows  Discharge Discharge Education: Engorgement and breast care (mom asked about engorgement prevention) Pump: Manual;Personal (s2)  Consult Status Consult Status: Follow-up Date: 03/27/22 Follow-up type: In-patient    Ferne Coe [facility] 03/26/2022, 8:37 AM

## 2022-03-26 NOTE — Progress Notes (Signed)
Postpartum and newborn Discharge teaching done with mom. Hugs removed. ID bracelets confirmed.

## 2022-03-26 NOTE — Anesthesia Postprocedure Evaluation (Signed)
Anesthesia Post Note  Patient: Cathy Hartman  Procedure(s) Performed: AN AD HOC LABOR EPIDURAL     Patient location during evaluation: Mother Baby Anesthesia Type: Epidural Level of consciousness: awake and alert Pain management: pain level controlled Vital Signs Assessment: post-procedure vital signs reviewed and stable Respiratory status: spontaneous breathing, nonlabored ventilation and respiratory function stable Cardiovascular status: stable Postop Assessment: no headache, no backache and epidural receding Anesthetic complications: no   No notable events documented.  Last Vitals:  Vitals:   03/25/22 2303 03/26/22 0316  BP: 107/69 (!) 107/59  Pulse: 81 70  Resp: 19 16  Temp: 37.4 C 36.8 C  SpO2: 99%     Last Pain:  Vitals:   03/26/22 0316  TempSrc: Oral  PainSc:    Pain Goal:                   Demetris Capell

## 2022-03-29 ENCOUNTER — Inpatient Hospital Stay (HOSPITAL_COMMUNITY): Admit: 2022-03-29 | Payer: Self-pay

## 2022-04-04 ENCOUNTER — Telehealth (HOSPITAL_COMMUNITY): Payer: Self-pay | Admitting: *Deleted

## 2022-04-04 NOTE — Telephone Encounter (Signed)
Attempted hospital discharge follow-up call. Left message for patient to return RN call with any questions or concerns. Erline Levine, RN, 04/04/22, [phone]

## 2022-05-08 DIAGNOSIS — Z1389 Encounter for screening for other disorder: Secondary | ICD-10-CM | POA: Diagnosis not present
